# Patient Record
Sex: Female | Born: 1990 | Race: White | Hispanic: No | Marital: Single | Smoking: Never smoker
Health system: Southern US, Community
[De-identification: ages and names within clinical notes are randomized; demographics above are authoritative.]

## PROBLEM LIST (undated history)

## (undated) DIAGNOSIS — F419 Anxiety disorder, unspecified: Secondary | ICD-10-CM

## (undated) DIAGNOSIS — I10 Essential (primary) hypertension: Secondary | ICD-10-CM

## (undated) DIAGNOSIS — F410 Panic disorder [episodic paroxysmal anxiety] without agoraphobia: Secondary | ICD-10-CM

## (undated) HISTORY — PX: CYSTOSCOPY: SUR368

## (undated) HISTORY — DX: Anxiety disorder, unspecified: F41.9

## (undated) HISTORY — DX: Panic disorder (episodic paroxysmal anxiety): F41.0

## (undated) HISTORY — PX: NO PAST SURGERIES: SHX2092

## (undated) HISTORY — DX: Essential (primary) hypertension: I10

---
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
## 2010-03-07 ENCOUNTER — Emergency Department (HOSPITAL_COMMUNITY): Admission: EM | Admit: 2010-03-07 | Discharge: 2010-03-07 | Payer: Self-pay | Admitting: Emergency Medicine

## 2010-11-23 LAB — GLUCOSE, CAPILLARY
Glucose-Capillary: 221 mg/dL — ABNORMAL HIGH (ref 70–99)
Glucose-Capillary: 332 mg/dL — ABNORMAL HIGH (ref 70–99)
Glucose-Capillary: 358 mg/dL — ABNORMAL HIGH (ref 70–99)

## 2010-11-23 LAB — BASIC METABOLIC PANEL
BUN: 12 mg/dL (ref 6–23)
CO2: 27 mEq/L (ref 19–32)
Calcium: 9.6 mg/dL (ref 8.4–10.5)
Chloride: 98 mEq/L (ref 96–112)
Creatinine, Ser: 0.62 mg/dL (ref 0.4–1.2)
GFR calc Af Amer: >60 mL/min (ref >60)
GFR calc non Af Amer: >60 mL/min (ref >60)
Glucose, Bld: 359 mg/dL — ABNORMAL HIGH (ref 70–99)
Potassium: 4.3 mEq/L (ref 3.5–5.1)
Sodium: 136 mEq/L (ref 135–145)

## 2011-01-23 NOTE — Discharge Summary (Signed)
Chelsea Sparks, Chelsea Sparks              ACCOUNT NO.:  [id number]   MEDICAL RECORD NO.:  [id number]          PATIENT TYPE:  INP   LOCATION:  6153                         FACILITY:  [facility]   PHYSICIAN:  Henrietta Hoover, MD    DATE OF BIRTH:  01-26-1991   DATE OF ADMISSION:  01/12/2007  DATE OF DISCHARGE:  01/15/2007                               DISCHARGE SUMMARY   REASON FOR HOSPITALIZATION:  DKA, elevated blood sugars.   SIGNIFICANT FINDINGS:  The patient came in with hypotension and elevated  blood sugars in the 530s, initial pH was 6.9, anion gap of 34,  bicarbonate 4, potassium 4.5.  With treatment these normalized and anion  gap closed and bicarbonate normalized.  During hospitalization the  patient developed fever and back pain.  Urinanalysis showed 11-20 red  blood cells, many bacteria and leucocytes.  Urine culture pending.   TREATMENT:  IV fluid resuscitation treatment of DKA in PICU with 2-bag  method insulin drip.  The patient transitioned to floor on hospital day  #2 and started on Lantus and NovoLog insulin.   OPERATIONS AND PROCEDURES:  None.   DISCHARGE DIAGNOSES:  Diabetic ketoacidosis, type I diabetes, urinary  tract infection.   DISCHARGE INSTRUCTIONS:  Lantus 28 units subcutaneously at bedtime;  ciprofloxacin 500 mg p.o. twice per day for five more days; Humalog 1  unit for _______ greater than 150 correcting before each meal and at  bedtime, and Humalog with a 1-15 carbohydrate correction.   PENDING ISSUES:  Possible depression, urine cultures and sensitivities,  and diabetes.   FOLLOW-UP:  Followup with doctor Dr. Donnie Coffin May 13, Tuesday, at 5:15 p.m.   CONDITION ON DISCHARGE:  Discharge wait 50 kg.     ______________________________  Marlyne Beards, M.D.    ______________________________  Henrietta Hoover, MD    EP/MEDQ  D:  01/15/2007  T:  01/15/2007  Job:  [id number]

## 2011-01-23 NOTE — Discharge Summary (Signed)
Chelsea Sparks, Chelsea Sparks              ACCOUNT NO.:  [id number]   MEDICAL RECORD NO.:  [id number]          PATIENT TYPE:  INP   LOCATION:  6126                         FACILITY:  [facility]   PHYSICIAN:  Orie Rout, M.D.DATE OF BIRTH:  Jul 28, 1991   DATE OF ADMISSION:  11/26/2005  DATE OF DISCHARGE:  12/03/2005                                 DISCHARGE SUMMARY   DISCHARGE DIAGNOSES:  1.  Severe diabetic ketoacidosis, resolved.  2.  Type 1 diabetes.  3.  Hypokalemia.  4.  Hypertension.   DISCHARGE MEDICATIONS:  1.  Lantus 13 units q.h.s.  2.  Humalog two-component sliding scale with meals.  3.  Enalapril 10 mg daily.  4.  K-Dur 20 mEq p.o. b.i.d.   HOSPITAL COURSE:  This is a 20 year old female with a history of type 1  diabetes admitted in severe DKA to the PICU.  Her labs on admission were  significant for an ABG with a pH of 6.78, a bicarb of 5, a CO2 of 8.9, an  anion gap of 23, a glucose of 521, and a creatinine of 1.3.  The patient was  started on an insulin drip of 0.05 units/kg per hour and a two-bag method of  IV fluids.  Bag #1 had LR and bag #2 had D10 half normal saline.  The  patient became hypokalemic on hospital day #2, so we added potassium to her  fluids and diet.  Her gas had closed and acidosis resolved by hospital day  #3, so on that day she was transitioned off the insulin drip to sliding  scale insulin and Lantus and transferred to the floor.  Her potassium  remained low and we started supplementation with K-Dur between 40 and 60 mEq  daily.  The patient tolerated a carb-modified diet and her CBGs remained  under 300 with fastings less than 140 on the day prior to discharge.  Her  urine ketones cleared three days prior to discharge.  Her potassium came up  to 3.3 on the day of discharge.  During this hospitalization we also  increased her enalapril for high blood pressure up to 10 mg daily.  This was  done about three days prior to being sent home.  We also  sent her home with  a potassium supplement.  Dr. Fransico Michael consulted and adjusted her sliding  scale insulin and will be following up with her and her mother as an  outpatient.  During this hospitalization there were also some concerns  raised of the lack of parental involvement in helping this teenager with her  illness, so a CPS report was filed.  The patient also received a consult by  our pediatric psychologist on multiple days.   DISCHARGE WEIGHT:  54.5 kg.   DISCHARGE CONDITION:  Good.   FOLLOW-UP INSTRUCTIONS:  It is critical that she takes all her medications  exactly as prescribed.  This was explained to her and the reason for each of  them.   She has a follow-up appointment with Dr. Donnie Coffin Tuesday, April 3, at 5:15  p.m.  She will need a BMP checked then.  Dr. Van Clines will call her on April 4 and schedule follow-up.      Altamese Cabal, M.D.    ______________________________  Orie Rout, M.D.    KS/MEDQ  D:  12/03/2005  T:  12/05/2005  Job:  [id number]

## 2011-01-23 NOTE — Op Note (Signed)
NAMEPIPER, HASSEBROCK              ACCOUNT NO.:  [id number]   MEDICAL RECORD NO.:  [id number]          PATIENT TYPE:  OIB   LOCATION:  6125                         FACILITY:  [facility]   PHYSICIAN:  Prabhakar D. Pendse, M.D.DATE OF BIRTH:  11/23/1990   DATE OF PROCEDURE:  10/16/2004  DATE OF DISCHARGE:  10/17/2004                                 OPERATIVE REPORT   PREOPERATIVE DIAGNOSIS:  Abscess, of sacrococcygeal area, possible pilonidal  cyst.   POSTOPERATIVE DIAGNOSIS:  Abscess, of sacrococcygeal area, possible  pilonidal cyst.   OPERATION PERFORMED:  Incision and drainage of sacrococcygeal area abscess.   SURGEON:  Prabhakar D. Levie Heritage, M.D.   ASSISTANT:  Nurse.   ANESTHESIA:  Nurse   OPERATIVE INDICATIONS:  This 20 year old girl was noted to have an acute  abscess of sacrococcygeal area.  The abscess had started draining some foul-  smelling purulent material 24 hours prior to this procedure.  The lesion was  about 2 inches in length and 1 inch in width.   OPERATIVE PROCEDURE:  Under satisfactory general endotracheal anesthesia,  the patient in prone position, sacrococcygeal area was thoroughly prepped  and draped in the usual manner. A hemostat was passed through the opening  which was draining purulent material and over the hemostat an incision was  made, carried through all the layers of the skin and subcutaneous tissue.  The abscess cavity was entered, purulent material collected for culture  examination, and debridement was done, the abscess cavity irrigated, packed  with iodoform gauze, and bulky dressing applied. Throughout the procedure,  the patient's vital signs remained stable. The patient withstood the  procedure well and was transferred to recovery room in satisfactory general  condition.      PDP/MEDQ  D:  11/20/2004  T:  11/20/2004  Job:  [id number]

## 2011-01-23 NOTE — Discharge Summary (Signed)
[place]  Patient:    Chelsea Sparks, Chelsea Sparks                     MRN: [id number] Adm. Date:  [id number] Disc. Date: [id number] Attending:  Delle Reining Dictator:   [facility],                           Discharge Summary  DATE OF BIRTH:  Apr 21, 1991  REASON FOR HOSPITALIZATION: 1. Diabetes mellitus. 2. Seasonal allergies.  DISCHARGE SUMMARY:  The patient is an 20 year old white female who was referred by her primary care physician secondary to likely onset of type 1 diabetes.  The patient had presented to his office "the office of Dr. Maryellen Pile" with complaints of being "tired" x 2-3 days, light-headed, and having increasing thirst for the last 4-5 days.  The patient also had experienced increased urination and some vomiting which was non-bilious and non-bloody. The patient had had a few loose stools over the last two days.  The patient had a urinalysis done at the primary cares office which revealed a glucose of greater than 1000 and were positive for ketones.  The patient had also complained of headaches, weight loss of a few pounds over the last two weeks, rhinorrhea, and watery eyes.  Mother notes that the patient had a flu-like illness one week prior to admission with generalized nausea, some vomiting, and diarrhea, and fever.  On presentation to [facility], the patient was found to have a blood glucose of 276.  Anion gap was 9.  Blood was negative for ketones.  The patient received IV fluids and subcutaneous insulin for increased blood glucose.  She was initially started on a regimen of 5 units of Lantus long-acting insulin q.h.s.  She was also given a sliding scale using Humalog lispro.  She maintained usual blood sugars in the 180s to 190s with lunch time peaks in the 200s.  Several days into hospitalization, she was changed to carbohydrate counting. For every one unit of carbohydrates which equalled 15 g, she  received 1 unit of lispro for each meal.  She also had a sliding scale in addition to this to correct for approximation of carbohydrates.  She had regular blood glucose checks before every meal and at nighttime.  The patient also received every day       2 a.m. glucose checks as well.  During most of the hospitalization, the patient had carbohydrate counting with each meal and sliding scale with each meal.  Her nighttime Lantus dose was increased throughout the hospitalization one unit at a time to a final measurement of 8 units at night.  She received extensive diabetic teaching during her hospital stay and extensive nutritional education.  She was able to administer her own insulin and participate in carbohydrate counting.  Her stay at seven days in the hospital was not so much due to an inability to stabilize her blood glucose level, as she did not present in DKA.  However, it was mostly due to less than an ideal social situation and inability to get the patient into a local endocrinologist office.  It is felt that due to social reasons, she needed an extensive stay and monitoring during her first week of insulin therapy.  Upon discharge, there were many discussions with people in the patients life including home, school, and medical.  Discussions took place with Dr. Donnie Coffin, who is  the patients primary doctor, Ms. Hilarie Fredrickson who is the school nurse, and Mr. Gwenith Daily who is the school teacher.  General discussions took place with mother over the phone as she had just started a new job and could not take off work for fear of possible eviction from her apartment.  Everyone involved including the patient new the plan for the week until she could eventually see her endocrinologist, Dr. Gaye Alken.  The plan was for the house staff here at [facility] to contact the patient daily and check on her blood glucose levels, and for any possible problems that may arise in the transition from hospital to  home care.  The patient was given emergency numbers including a card with diabetic educator, the pediatric floor to get in touch with any of the pediatric residents, and 911 in the case of an emergency.  We have asked mother to follow up with primary care doctor when possible in the next one to two weeks.  The patient also has appointment with Dr. Gaye Alken on December 24, 2000.  OTHER ISSUES DURING HOSPITALIZATION:  Included headaches and seasonal allergies, as well as some occasional epigastric abdominal pain.  For her headaches, we usually treated with Tylenol and the patient reported improvement with both rest and Tylenol with most of the headaches.  For the seasonal allergies, we started the patient on Claritin 10 mg p.o. q.d. For the epigastric "burning pain" during hospitalization, we used symptomatic treatment with Mylanta or Maalox.  However, an H2 blocker may be considered for the future if this is a prolonged symptom.  MEDICATIONS ON DISCHARGE: 1. Claritin 10 mg p.o. q.d. 2. Lantus insulin 8 units q.h.s. 3. Lispro insulin, dose per carbohydrate counting and sliding scale.  For    every one card or 15 g of card, the patient will receive 1 unit for each    meal.  The patient also has a sliding scale for insulin to add to the    carbohydrate counting for each meal.  Sliding scale includes the following;    for 200-300 one unit is added, for 301-400 two units are added, for greater    than 400 three units are added.  The patient was also given prescriptions    for ________, test strips, insulin both Lantus and lispro, ketosticks to    check urine when blood glucose is greater than 250, instructions for school    glucometer use, instructions for emergency.  The patient also has been    contacted and has participated in the [facility]    which will be following up with her as an outpatient. DD:  12/26/00 TD:  12/26/00 Job: [id number]

## 2011-01-23 NOTE — Discharge Summary (Signed)
[place]  Patient:    Chelsea Sparks, Chelsea Sparks                     MRN: [id number] Adm. Date:  [id number] Disc. Date: [id number] Attending:  Delle Sparks Dictator:   Chelsea Sparks, M.D. CC:         Chelsea Sparks. Chelsea Sparks, M.D.   Discharge Summary  HISTORY OF PRESENT ILLNESS:  The patient is an 20 year old white female who was referred by her primary care physician, Dr. Teena Sparks. Chelsea Sparks, for admission secondary to new-onset type 1 diabetes, after presenting to his office with complaints of fatigue x 2 to 3 days, light-headedness, polydipsia x 4 to 5 days, polyuria, nonbilious vomiting and nonbloody loose stools x 2 days.  A urinalysis at the physicians office revealed a glucose of greater than 1000 and was positive for ketones.  Patient had also had complaints of headache, weight loss, rhinorrhea, watery eyes and a recent past medical history is significant for a flu-like illness during the previous week, characterized by nausea, vomiting and diarrhea.  HOSPITAL COURSE:  Upon presentation to [facility], the patient was found to have a blood glucose of 276 with an anion gap of 9.  The patients blood was negative for acetone and patient did not have the clinical picture of DKA. She was given IV fluids and blood glucoses were monitored closely.  She was started on a regimen of 5 units of Lantus insulin and a sliding scale in addition to this with Lispro.  She continued to have blood glucoses in the 180s and 190s, with lunch-time peaks in the 200s.  In addition to her regimen, she started using carbohydrate counting, with 15 g of carbohydrates equal to 1 unit of Lispro.  A sliding-scale Lispro insulin was also administered based on pre-meal sugar levels:  With 200-300, giving 1 units; 300-400, giving 2 units; and greater than 400 blood glucose, giving 3 units.  Her Lantus dose was increased during her hospitalization to 8 units, up from 5  units initially.  The Lantus was given only q.h.s.  She has received extensive diabetic teaching and nutritional education while in the hospital and has done well with the self-administration of her insulin and carbohydrate counting. Her stay of seven days was not so much due to her inability to stabilize her blood glucose level, as she did not present in DKA, however, this was due to less than ideal social situation and inability to get Chelsea Sparks into an endocrinologists office.  Her family members were unable to come and stay with her in the hospital and Chelsea Sparks found herself doing most of her treatment alone.  Her mom was starting a new job and was unable to be with her in the hospital daily during the day.  Upon discharge, several discussions took place with Chelsea Sparks primary care doctor, Dr. Donnie Sparks, school nurse, Chelsea Sparks, and school teacher, Chelsea Sparks. All Sparks were informed of plans for her through this week and after discharge, she will see her endocrinologist, Dr. Gaye Sparks.  The diabetic educator, Chelsea Sparks, is also working with Chelsea Sparks intensively and will be talking with Chelsea Sparks school nurse as well.  Members of the house staff will be calling Chelsea Sparks until her appointment with Dr. Gaye Sparks.  We have also asked for the home health care company to provide a visit at the time to the home to assess her safety and progress as Chelsea Sparks transitions from hospital to home administration of  her insulin.  Chelsea Sparks continued to have several headaches during the hospitalization which were usually unilateral, and on one occasion, experienced visual changes, however, her headaches were improved with Tylenol and rest.  Patient also has a history of seasonal allergies for which we started Claritin 10 mg p.o. q.d.  Patient also experienced postprandial epigastric burning pain during the hospitalization on several occasions.  For now, we are recommending Mylanta or Tums, but it may be considered to add  an H2 blocker to her regimen in the future.  DISCHARGE MEDICATIONS: 1. Lantus 8 units q.h.s. 2. Claritin 10 mg p.o. q.d. 3. Lispro insulin per carbohydrate counting at sliding scale referred to in    the summary part of this.  DIABETIC REGIMEN:  Her regimen will be Lantus 8 units at night with Accu-Cheks every morning and with carbohydrate counting.  She will give 1 unit of Lispro for ______  of carbohydrates eaten.  Her sliding scale in addition to this pre-meal dose will be 1 unit if blood glucoses are 200 to 300, 2 units if blood glucoses are 300 to 400 and 3 units if greater than 400.  DD:  04/12/01 TD:  04/14/01 Job: [id number]

## 2011-01-23 NOTE — Discharge Summary (Signed)
NAMEAERIANNA, Chelsea Sparks              ACCOUNT NO.:  [id number]   MEDICAL RECORD NO.:  [id number]          PATIENT TYPE:  OIB   LOCATION:  6125                         FACILITY:  [facility]   PHYSICIAN:  Pediatrics Resident    DATE OF BIRTH:  May 19, 1991   DATE OF ADMISSION:  10/16/2004  DATE OF DISCHARGE:  10/17/2004                                 DISCHARGE SUMMARY   REASON FOR ADMISSION:  Status post pilonidal abscess drainage.   HOSPITAL COURSE:  [name] is a 20 year old female with a history of type 1  diabetes who was admitted status post an incision and drainage of a sacral  abscess which was found to be a pilonidal abscess.  Preliminary abscess  cultures grew moderate white blood cells, predominantly PMNs, no squamous  epithelial cells, moderate gram-positive cocci in pairs, moderate gram-  positive rods, few gram-negative rods.  Treatment included IV fluids, hep-  locked.  She was given her Regular insulin regimen of 1 unit per 15 grams of  carbohydrates, 1 unit per 20 mg blood glucose over 120, and Lantus 23 units  at bedtime.  She also received Tylenol No. 3 p.r.n. for pain.  She received  amoxicillin 500 mg q.12h. for E. coli prophylaxis.   PROCEDURE:  Drainage and packing of pilonidal abscess October 16, 2004.   FINAL DIAGNOSIS:  Pilonidal abscess.   DISCHARGE INSTRUCTIONS:  Apply Bactroban ointment to wound 2-3 times per  day, apply warm compresses to wound 2-3 times per day.  Tylenol No. 3 one  p.o. q.6h. as needed for pain, otherwise use regular Tylenol or Motrin for  pain.  Follow up with Dr. Levie Heritage on Tuesday, October 21, 2004 at 2 p.m. for  removal of packing.  Pending results October 16, 2004 wound culture.  Pending at the time of discharge followup with Dr. Levie Heritage on October 21, 2004 at 2 p.m.  Discharge weight 54 kg.   CONDITION ON DISCHARGE:  Stable.   DICTATED BY:  Dr. Tina Griffiths.      PR/MEDQ  D:  10/17/2004  T:  10/18/2004  Job:  [id number]

## 2011-02-07 ENCOUNTER — Emergency Department (HOSPITAL_COMMUNITY)
Admission: EM | Admit: 2011-02-07 | Discharge: 2011-02-07 | Disposition: A | Discharge status: Home / Self Care | Payer: Self-pay | Attending: Emergency Medicine | Admitting: Emergency Medicine

## 2011-02-07 DIAGNOSIS — L03119 Cellulitis of unspecified part of limb: Secondary | ICD-10-CM | POA: Insufficient documentation

## 2011-02-07 DIAGNOSIS — E109 Type 1 diabetes mellitus without complications: Secondary | ICD-10-CM | POA: Insufficient documentation

## 2011-02-07 DIAGNOSIS — Z794 Long term (current) use of insulin: Secondary | ICD-10-CM | POA: Insufficient documentation

## 2011-02-07 DIAGNOSIS — L02419 Cutaneous abscess of limb, unspecified: Secondary | ICD-10-CM | POA: Insufficient documentation

## 2011-06-04 LAB — POCT I-STAT, CHEM 8
Chloride: 102
HCT: 44
Hemoglobin: 15
TCO2: 27

## 2011-10-23 ENCOUNTER — Encounter (HOSPITAL_COMMUNITY): Payer: Self-pay | Admitting: Emergency Medicine

## 2011-10-23 ENCOUNTER — Emergency Department (HOSPITAL_COMMUNITY)
Admission: EM | Admit: 2011-10-23 | Discharge: 2011-10-23 | Disposition: A | Discharge status: Home / Self Care | Payer: 59 | Attending: Emergency Medicine | Admitting: Emergency Medicine

## 2011-10-23 DIAGNOSIS — Z794 Long term (current) use of insulin: Secondary | ICD-10-CM | POA: Insufficient documentation

## 2011-10-23 DIAGNOSIS — R5381 Other malaise: Secondary | ICD-10-CM | POA: Insufficient documentation

## 2011-10-23 DIAGNOSIS — R739 Hyperglycemia, unspecified: Secondary | ICD-10-CM

## 2011-10-23 DIAGNOSIS — E119 Type 2 diabetes mellitus without complications: Secondary | ICD-10-CM | POA: Insufficient documentation

## 2011-10-23 DIAGNOSIS — R51 Headache: Secondary | ICD-10-CM | POA: Insufficient documentation

## 2011-10-23 DIAGNOSIS — R1110 Vomiting, unspecified: Secondary | ICD-10-CM | POA: Insufficient documentation

## 2011-10-23 DIAGNOSIS — R5383 Other fatigue: Secondary | ICD-10-CM | POA: Insufficient documentation

## 2011-10-23 LAB — GLUCOSE, CAPILLARY: Glucose-Capillary: 217 mg/dL — ABNORMAL HIGH (ref 70–99)

## 2011-10-23 LAB — URINALYSIS, ROUTINE W REFLEX MICROSCOPIC
Bilirubin Urine: NEGATIVE
Glucose, UA: >1000 mg/dL — AB
Hgb urine dipstick: NEGATIVE
Ketones, ur: 40 mg/dL — AB
Leukocytes, UA: NEGATIVE
Protein, ur: NEGATIVE mg/dL
Specific Gravity, Urine: 1.023 (ref 1.005–1.030)
Urobilinogen, UA: 0.2 mg/dL (ref 0.0–1.0)
pH: 6 (ref 5.0–8.0)

## 2011-10-23 LAB — BASIC METABOLIC PANEL
BUN: 13 mg/dL (ref 6–23)
CO2: 25 mEq/L (ref 19–32)
Chloride: 95 mEq/L — ABNORMAL LOW (ref 96–112)
Creatinine, Ser: 0.4 mg/dL — ABNORMAL LOW (ref 0.50–1.10)
GFR calc Af Amer: >90 mL/min (ref >90)
GFR calc non Af Amer: >90 mL/min (ref >90)
Glucose, Bld: 282 mg/dL — ABNORMAL HIGH (ref 70–99)

## 2011-10-23 LAB — URINE MICROSCOPIC-ADD ON

## 2011-10-23 LAB — POCT PREGNANCY, URINE: Preg Test, Ur: NEGATIVE

## 2011-10-23 MED ORDER — INSULIN LISPRO 100 UNIT/ML ~~LOC~~ SOLN: 0.0000 [IU] | Freq: Three times a day (TID) | SUBCUTANEOUS | Status: DC

## 2011-10-23 MED ORDER — SODIUM CHLORIDE 0.9 % IV BOLUS (SEPSIS)
1000.0000 mL | Freq: Once | INTRAVENOUS | Status: AC
  Administered 2011-10-23: 1000 mL via INTRAVENOUS

## 2011-10-23 MED ORDER — INSULIN GLARGINE 100 UNIT/ML ~~LOC~~ SOLN: 24.0000 [IU] | Freq: Every day | SUBCUTANEOUS | Status: DC

## 2011-10-23 NOTE — ED Provider Notes (Signed)
History     CSN: [id number]  Arrival date & time 10/23/11  1433   First MD Initiated Contact with Patient 10/23/11 1547      Chief Complaint  Patient presents with  . Hyperglycemia    (Consider location/radiation/quality/duration/timing/severity/associated sxs/prior treatment) HPI complains blood sugar up this morning, was 403 11 AM today vomited one time. Treated herself with insulin felt improved blood sugar went down to 239, then increased to 259 immediately prior to coming here. Patient has not been taking insulin religiously as she has been in short supply. Has slight headache presently no nausea present. No other complaint Past Medical History  Diagnosis Date  . Diabetes mellitus     History reviewed. No pertinent past surgical history.  History reviewed. No pertinent family history.  History  Substance Use Topics  . Smoking status: Never Smoker   . Smokeless tobacco: Not on file  . Alcohol Use: Yes    OB History    Grav Para Term Preterm Abortions TAB SAB Ect Mult Living                  Review of Systems  Constitutional: Positive for fatigue.  Respiratory: Negative.   Cardiovascular: Negative.   Gastrointestinal: Negative.   Musculoskeletal: Negative.   Skin: Negative.   Neurological: Positive for headaches.  Hematological: Negative.   Psychiatric/Behavioral: Negative.     Allergies  Review of patient's allergies indicates no known allergies.  Home Medications  No current outpatient prescriptions on file.  BP 109/83  Pulse 122  Temp(Src) 98.6 F (37 C) (Oral)  Resp 18  Ht 5\' 5"  (1.651 m)  Wt 102 lb (46.267 kg)  BMI 16.97 kg/m2  SpO2 100%  LMP 10/13/2011  Physical Exam  Constitutional: She appears well-developed and well-nourished.  HENT:  Head: Normocephalic and atraumatic.  Eyes: Conjunctivae are normal. Pupils are equal, round, and reactive to light.  Neck: Neck supple. No tracheal deviation present. No thyromegaly present.    Cardiovascular: Regular rhythm.   No murmur heard. Pulmonary/Chest: Effort normal and breath sounds normal.  Abdominal: Soft. Bowel sounds are normal. She exhibits no distension. There is no tenderness.  Musculoskeletal: Normal range of motion. She exhibits no edema and no tenderness.  Neurological: She is alert. Coordination normal.  Skin: Skin is warm and dry. No rash noted.  Psychiatric: She has a normal mood and affect.    ED Course  Procedures (including critical care time) Results for orders placed during the hospital encounter of 10/23/11  GLUCOSE, CAPILLARY      Component Value Range   Glucose-Capillary 259 (*) 70 - 99 (mg/dL)  BASIC METABOLIC PANEL      Component Value Range   Sodium 134 (*) 135 - 145 (mEq/L)   Potassium 3.7  3.5 - 5.1 (mEq/L)   Chloride 95 (*) 96 - 112 (mEq/L)   CO2 25  19 - 32 (mEq/L)   Glucose, Bld 282 (*) 70 - 99 (mg/dL)   BUN 13  6 - 23 (mg/dL)   Creatinine, Ser 5.62 (*) 0.50 - 1.10 (mg/dL)   Calcium 9.6  8.4 - 13.0 (mg/dL)   GFR calc non Af Amer >90  >90 (mL/min)   GFR calc Af Amer >90  >90 (mL/min)  POCT PREGNANCY, URINE      Component Value Range   Preg Test, Ur NEGATIVE  NEGATIVE   GLUCOSE, CAPILLARY      Component Value Range   Glucose-Capillary 217 (*) 70 - 99 (mg/dL)  Comment 1 Documented in Chart     Comment 2 Notify RN     No results found.  Labs Reviewed  GLUCOSE, CAPILLARY - Abnormal; Notable for the following:    Glucose-Capillary 259 (*)    All other components within normal limits   6:30 PM patient feels improved after intravenous hydration No results found.  No diagnosis found.    MDM  Plan prescriptions for insulin, patient has syringes Referral [facility] Diagnosis hyperglycemia        Doug Sou, MD 10/23/11 1900

## 2011-10-23 NOTE — ED Notes (Signed)
Pt took blood glucose this am and it was 403, pt took 8 units Humalog sub-q around 1130. Pt states she is not consistent taking her blood sugars or taking insulin. Pt reports N/V today. Denies other c/o.

## 2011-11-20 ENCOUNTER — Ambulatory Visit (INDEPENDENT_AMBULATORY_CARE_PROVIDER_SITE_OTHER): Payer: 59 | Admitting: Internal Medicine

## 2011-11-20 VITALS — BP 105/74 | HR 118 | Temp 98.1°F | Resp 16 | Ht 67.0 in | Wt 114.0 lb

## 2011-11-20 DIAGNOSIS — R110 Nausea: Secondary | ICD-10-CM

## 2011-11-20 DIAGNOSIS — E109 Type 1 diabetes mellitus without complications: Secondary | ICD-10-CM

## 2011-11-20 DIAGNOSIS — R5383 Other fatigue: Secondary | ICD-10-CM

## 2011-11-20 LAB — COMPREHENSIVE METABOLIC PANEL
ALT: 10 U/L (ref 0–35)
AST: 12 U/L (ref 0–37)
Albumin: 4.7 g/dL (ref 3.5–5.2)
BUN: 13 mg/dL (ref 6–23)
Calcium: 9.8 mg/dL (ref 8.4–10.5)
Chloride: 102 mEq/L (ref 96–112)
Potassium: 4.1 mEq/L (ref 3.5–5.3)

## 2011-11-20 LAB — POCT CBC
MCH, POC: 29.4 pg (ref 27–31.2)
MCHC: 33.4 g/dL (ref 31.8–35.4)
MCV: 87.8 fL (ref 80–97)
POC Granulocyte: 4.2 (ref 2–6.9)
RBC: 4.7 M/uL (ref 4.04–5.48)
WBC: 7.2 10*3/uL (ref 4.6–10.2)

## 2011-11-20 LAB — POCT UA - MICROSCOPIC ONLY
Casts, Ur, LPF, POC: NEGATIVE
Crystals, Ur, HPF, POC: NEGATIVE
Mucus, UA: NEGATIVE
Yeast, UA: NEGATIVE

## 2011-11-20 LAB — POCT URINALYSIS DIPSTICK
Leukocytes, UA: NEGATIVE
Nitrite, UA: NEGATIVE
Protein, UA: NEGATIVE
Spec Grav, UA: 1.01
Urobilinogen, UA: 0.2
pH, UA: 6

## 2011-11-20 MED ORDER — INSULIN GLARGINE 100 UNIT/ML ~~LOC~~ SOLN: 24.0000 [IU] | Freq: Every day | SUBCUTANEOUS | Status: DC

## 2011-11-20 MED ORDER — INSULIN LISPRO 100 UNIT/ML ~~LOC~~ SOLN: 0.0000 [IU] | Freq: Three times a day (TID) | SUBCUTANEOUS | Status: DC

## 2011-11-20 MED ORDER — ONDANSETRON 4 MG PO TBDP
8.0000 mg | ORAL_TABLET | Freq: Once | ORAL | Status: AC
  Administered 2011-11-20: 8 mg via ORAL

## 2011-11-20 NOTE — Progress Notes (Signed)
  Subjective:    Patient ID: Chelsea Sparks, female    DOB: 02-03-1991, 21 y.o.   MRN: [id number]  HPI  Chelsea Sparks is seen with her Mother today as a new patient.  She is a type I diabetic that has not had insurance for the past three years and her diabetes has not been well controlled.  She comes in today complaining of nausea, fatigue.  She has an appointment to see a [facility] endocrinologist May 2nd but would like an appointment sooner.  She has never used an insulin pump, only pens.  She is unemployed, non-smoker.    Review of Systems  Constitutional: Positive for fatigue.  HENT: Negative.   Eyes: Negative.   Respiratory: Negative.   Cardiovascular: Negative.  Negative for palpitations.  Gastrointestinal: Positive for diarrhea.  Musculoskeletal: Negative.   Neurological: Positive for dizziness.  Hematological: Negative.   Psychiatric/Behavioral: Negative.        Objective:   Physical Exam  Vitals reviewed. Constitutional: She is oriented to person, place, and time. She appears well-developed and well-nourished.  HENT:  Head: Normocephalic.  Neck: Neck supple.  Cardiovascular: Regular rhythm and normal heart sounds.        tachycardic  Pulmonary/Chest: Effort normal and breath sounds normal. No respiratory distress.  Abdominal: Soft.  Neurological: She is alert and oriented to person, place, and time.  Skin: Skin is warm and dry.  Psychiatric: She has a normal mood and affect. Her behavior is normal.   Less tachycardic after IV fluids given       Assessment & Plan:  Type I DM with overall poor control, A1C is 9.7.  Insulins are refilled today and she will keep her upcoming endo appt.  Pt feels better after IV fluid, less tachycardic. No other abnormalities found, CMP pending.   AVS printed and given to pt.

## 2011-11-20 NOTE — Patient Instructions (Signed)
Take your insulins as prescribed in the past and keep the upcoming appointment with your endocrinologist.  Keep yourself well hydrated.  RTC if symptoms worsen  Diabetes, Type 1 Diabetes is a long-term (chronic) disease. It occurs when the cells in the pancreas that make insulin (a hormone) are destroyed and can no longer make insulin. Type 1 diabetes was also previously called juvenile-onset diabetes. It most often occurs before the age of 25, but it can also occur in older people. CAUSES  Among other factors, the following may cause type 1 diabetes:  Genetics. This means it may be passed to you by your parents.   The beta cells that make insulin are destroyed. The cause of this is unknown.  SYMPTOMS   Urinating more than usual (or bed-wetting in children).   Drinking more than usual.   Irritability.   Feeling very hungry.   Weight loss (may be rapid).   Nausea and vomiting.   Abdominal pain.   Feeling more tired than usual (fatigue).   Rapid breathing.   Difficulty staying awake.   Night sweats.  DIAGNOSIS  Your blood is tested to determine whether you have type 1 diabetes. TREATMENT   You will need to check your blood glucose (sugar) levels several times a day.   You will need to balance insulin, a healthy meal plan, and exercise to maintain normal blood glucose.   Education and ongoing support is recommended.   You should have regular checkups and immunizations.  HOME CARE INSTRUCTIONS   Never run out of insulin. It is needed every day.   Do not skip insulin doses.   Do not skip meals. Eat healthy.   Follow your treatment and monitoring plan.   Wear a pendant or bracelet stating you have diabetes and take insulin.   If you start a new exercise or sport, watch for low blood glucose (hypoglycemia) symptoms. Insulin dosing may need to be adjusted.   Follow up with your caregiver regularly.   Tell your workplace or school about your diabetes treatment plan.   SEEK MEDICAL CARE IF:   You have problems keeping your blood glucose in target range.   You have blood glucose readings that are often too high or too low.   You have problems with your medicines.   You have symptoms of an illness that do not improve after 24 hours.   You have a sore or wound that is not healing.   You notice a change in vision or a new problem with your vision.   You have a fever.  MAKE SURE YOU:  Understand these instructions.   Will watch your condition.   Will get help right away if you are not doing well or get worse.  Document Released: 08/21/2000 Document Revised: 08/13/2011 Document Reviewed: 02/09/2011 [facility] Patient Information 2012 [place], [place].

## 2011-11-29 ENCOUNTER — Emergency Department (HOSPITAL_COMMUNITY)
Admission: EM | Admit: 2011-11-29 | Discharge: 2011-11-30 | Disposition: A | Discharge status: Home / Self Care | Payer: 59 | Attending: Emergency Medicine | Admitting: Emergency Medicine

## 2011-11-29 DIAGNOSIS — R300 Dysuria: Secondary | ICD-10-CM | POA: Insufficient documentation

## 2011-11-29 DIAGNOSIS — R000 Tachycardia, unspecified: Secondary | ICD-10-CM | POA: Insufficient documentation

## 2011-11-29 DIAGNOSIS — E109 Type 1 diabetes mellitus without complications: Secondary | ICD-10-CM | POA: Insufficient documentation

## 2011-11-29 DIAGNOSIS — R109 Unspecified abdominal pain: Secondary | ICD-10-CM | POA: Insufficient documentation

## 2011-11-29 DIAGNOSIS — Z794 Long term (current) use of insulin: Secondary | ICD-10-CM | POA: Insufficient documentation

## 2011-11-29 DIAGNOSIS — R110 Nausea: Secondary | ICD-10-CM | POA: Insufficient documentation

## 2011-11-30 ENCOUNTER — Encounter (HOSPITAL_COMMUNITY): Payer: Self-pay | Admitting: *Deleted

## 2011-11-30 LAB — URINALYSIS, ROUTINE W REFLEX MICROSCOPIC
Bilirubin Urine: NEGATIVE
Hgb urine dipstick: NEGATIVE
Protein, ur: NEGATIVE mg/dL
Urobilinogen, UA: 0.2 mg/dL (ref 0.0–1.0)

## 2011-11-30 LAB — URINE MICROSCOPIC-ADD ON

## 2011-11-30 LAB — COMPREHENSIVE METABOLIC PANEL
ALT: 8 U/L (ref 0–35)
AST: 11 U/L (ref 0–37)
Albumin: 4.3 g/dL (ref 3.5–5.2)
Calcium: 10 mg/dL (ref 8.4–10.5)
Sodium: 135 mEq/L (ref 135–145)
Total Protein: 8 g/dL (ref 6.0–8.3)

## 2011-11-30 LAB — CBC
MCH: 29.3 pg (ref 26.0–34.0)
MCV: 86.1 fL (ref 78.0–100.0)
Platelets: 322 10*3/uL (ref 150–400)
RDW: 11.4 % — ABNORMAL LOW (ref 11.5–15.5)

## 2011-11-30 MED ORDER — MORPHINE SULFATE 4 MG/ML IJ SOLN
4.0000 mg | Freq: Once | INTRAMUSCULAR | Status: AC
  Administered 2011-11-30: 4 mg via INTRAVENOUS
  Filled 2011-11-30: qty 1

## 2011-11-30 MED ORDER — PROMETHAZINE HCL 25 MG PO TABS: 25.0000 mg | ORAL_TABLET | Freq: Four times a day (QID) | ORAL | Status: DC | PRN

## 2011-11-30 MED ORDER — OXYCODONE-ACETAMINOPHEN 5-325 MG PO TABS: 1.0000 | ORAL_TABLET | ORAL | Status: AC | PRN

## 2011-11-30 MED ORDER — ONDANSETRON HCL 4 MG/2ML IJ SOLN
4.0000 mg | Freq: Once | INTRAMUSCULAR | Status: AC
Start: 2011-11-30 — End: 2011-11-30
  Administered 2011-11-30: 4 mg via INTRAVENOUS
  Filled 2011-11-30: qty 2

## 2011-11-30 MED ORDER — CIPROFLOXACIN HCL 500 MG PO TABS
500.0000 mg | ORAL_TABLET | Freq: Once | ORAL | Status: AC
  Administered 2011-11-30: 500 mg via ORAL
  Filled 2011-11-30: qty 1

## 2011-11-30 MED ORDER — CIPROFLOXACIN HCL 500 MG PO TABS: 500.0000 mg | ORAL_TABLET | Freq: Two times a day (BID) | ORAL | Status: AC

## 2011-11-30 MED ORDER — SODIUM CHLORIDE 0.9 % IV BOLUS (SEPSIS)
1000.0000 mL | Freq: Once | INTRAVENOUS | Status: AC
  Administered 2011-11-30: 1000 mL via INTRAVENOUS

## 2011-11-30 NOTE — Discharge Instructions (Signed)
You have been diagnosed with undifferentiated abdominal pain.  Abdominal pain can be caused by many things. Your caregiver evaluates the seriousness of your pain by an examination and possibly blood or urine tests and imaging (CT scan, x-rays, ultrasound). Many cases can be observed and treated at home after initial evaluation in the emergency department. Even though you are being discharged home, abdominal pain can be unpredictable. Therefore, you need a repeat exam if your pain does not resolve, returns, or worsens. Most patient's with abdominal pain do not need to be admitted to the hospital or have surgery, but serious problems like appendicitis and gallbladder attacks can start out as nonspecific pain. Many abdominal conditions cannot be diagnosed in 1 visit, so followup evaluations are very important.  Seek immediate medical attention if:  *The pain does not go away or becomes severe. *Temperature above 101 develops *Repeated vomiting occurs(multiple episodes) *The pain becomes localized to portions of the abdomen. The right side could possibly be appendicitis. In an adult, the left lower portion of the abdomen could be colitis or diverticulitis. *Blood is being passed in stools or vomit *Return also if you develop chest pain, difficulty breathing, dizziness or fainting, or become confused poorly responsive or inconsolable (young children).     If you do not have a physician, you should reference the below phone numbers and call in the morning to establish follow up care.  RESOURCE GUIDE  Dental Problems  Patients with Medicaid: [facility]                     [facility] [address]                                           [address] Phone:  [phone]                                                  Phone:  [phone]  If unable to pay or uninsured, contact:  [facility] or [facility] to become qualified for the adult dental  clinic.  Chronic Pain Problems Contact [facility]  [phone] Patients need to be referred by their primary care doctor.  Insufficient Money for Medicine Contact [employer]:  call "211" or [facility] 271-5999.  No Primary Care Doctor Call [facility]  832-8000 Other agencies that provide inexpensive medical care    [facility]  [phone]    [facility]  [phone]    [facility]  [phone]    [facility] Clinic  [phone]    [facility]  [phone]    [facility]  [phone]  Psychological Services [facility]  [phone] [facility]  [phone] [facility]   800 853-5163 (emergency services 641-4993)  Substance Abuse Resources [facility]  336-882-2125 [facility] 336-784-9470 [facility] 336-285-9073 [facility] 336-845-3988 Residential & Outpatient Substance Abuse Program  800-659-3381  Abuse/Neglect [place] Child Abuse Hotline (336) 641-3795 [place] [facility] Abuse Hotline 800-378-5315 (After Hours)  Emergency Shelter [facility] (336) 271-5985  [facility] (336) 275-9566 [facility]   Services (704) 372-4663  MRSA Hotline #:   832-7006    [place] [facility]  [facility] [place]     [employer]                          [facility] [address] [place]                       [address]      [address]  [place]                                                [place]                            [place] Phone:  [phone]                                   Phone:  [phone]                 Phone:  [phone]  [facility] Phone:  [phone]  [place] Child Abuse Hotline (336) 342-1394 (336) 342-3537 (After Hours)    

## 2011-11-30 NOTE — ED Notes (Signed)
Patient given discharge instructions, information, prescriptions, and diet order. Patient states that they adequately understand discharge information given and to return to ED if symptoms return or worsen.     

## 2011-11-30 NOTE — ED Provider Notes (Signed)
History     CSN: [id number]  Arrival date & time 11/29/11  2328   First MD Initiated Contact with Patient 11/30/11 0133      Chief Complaint  Patient presents with  . Flank Pain    kidney pain, hurts to urinate, states was treated two weeks ago for dehydration diagnosed by [facility],  back pain, nausea,  constant urge to go to bathroom    (Consider location/radiation/quality/duration/timing/severity/associated sxs/prior treatment) HPI Comments: 21 year old female with a history of type 1 diabetes who presents with bilateral flank pain, dysuria and approximately 2 weeks of nausea and abdominal pain. This has been persistent from day today, gradually getting worse and not associated with fevers or chills. She has been seen at the urgent care where she was told that she did not have a urinary infection.  She denies headache, sore throat, numbness, weakness, diarrhea.  The history is provided by the patient and a relative.    Past Medical History  Diagnosis Date  . Diabetes mellitus     History reviewed. No pertinent past surgical history.  History reviewed. No pertinent family history.  History  Substance Use Topics  . Smoking status: Never Smoker   . Smokeless tobacco: Not on file  . Alcohol Use: No    OB History    Grav Para Term Preterm Abortions TAB SAB Ect Mult Living                  Review of Systems  All other systems reviewed and are negative.    Allergies  Review of patient's allergies indicates no known allergies.  Home Medications   Current Outpatient Rx  Name Route Sig Dispense Refill  . INSULIN GLARGINE 100 UNIT/ML [place] SOLN Subcutaneous Inject 24 Units into the skin at bedtime.    . INSULIN LISPRO (HUMAN) 100 UNIT/ML [place] SOLN Subcutaneous Inject 0-10 Units into the skin 3 (three) times daily before meals. Sliding scale insulin; takes 1 unit of insulin per 40 score over 150 base CBG    . NAPROXEN SODIUM 220 MG PO TABS Oral Take 440 mg by mouth 2 (two)  times daily as needed. For pain.    Mar[place] Kitchen CIPROFLOXACIN HCL 500 MG PO TABS Oral Take 1 tablet (500 mg total) by mouth 2 (two) times daily. 14 tablet 0  . OXYCODONE-ACETAMINOPHEN 5-325 MG PO TABS Oral Take 1 tablet by mouth every 4 (four) hours as needed for pain. May take 2 tablets PO q 6 hours for severe pain - Do not take with Tylenol as this tablet already contains tylenol 15 tablet 0  . PROMETHAZINE HCL 25 MG PO TABS Oral Take 1 tablet (25 mg total) by mouth every 6 (six) hours as needed for nausea. 12 tablet 0    BP 114/79  Pulse 119  Temp(Src) 98.7 F (37.1 C) (Oral)  Resp 20  Wt 107 lb (48.535 kg)  SpO2 100%  LMP 11/13/2011  Physical Exam  Nursing note and vitals reviewed. Constitutional: She appears well-developed and well-nourished.       Uncomfortable appearing  HENT:  Head: Normocephalic and atraumatic.  Mouth/Throat: Oropharynx is clear and moist. No oropharyngeal exudate.  Eyes: Conjunctivae and EOM are normal. Pupils are equal, round, and reactive to light. Right eye exhibits no discharge. Left eye exhibits no discharge. No scleral icterus.  Neck: Normal range of motion. Neck supple. No JVD present. No thyromegaly present.  Cardiovascular: Regular rhythm, normal heart sounds and intact distal pulses.  Exam reveals no  gallop and no friction rub.   No murmur heard.      Tachycardia to 110  Pulmonary/Chest: Effort normal and breath sounds normal. No respiratory distress. She has no wheezes. She has no rales.  Abdominal: Soft. Bowel sounds are normal. She exhibits no distension and no mass. There is tenderness ( Mild epigastric and upper abdominal tenderness, no lower abdominal tenderness, non-peritoneal).       Bilateral mild CVA tenderness  Musculoskeletal: Normal range of motion. She exhibits no edema and no tenderness.  Lymphadenopathy:    She has no cervical adenopathy.  Neurological: She is alert. Coordination normal.  Skin: Skin is warm and dry. No rash noted. No  erythema.  Psychiatric: She has a normal mood and affect. Her behavior is normal.    ED Course  Procedures (including critical care time)  Labs Reviewed  URINALYSIS, ROUTINE W REFLEX MICROSCOPIC - Abnormal; Notable for the following:    APPearance CLOUDY (*)    Glucose, UA 100 (*)    Leukocytes, UA SMALL (*)    All other components within normal limits  CBC - Abnormal; Notable for the following:    RDW 11.4 (*)    All other components within normal limits  URINE MICROSCOPIC-ADD ON - Abnormal; Notable for the following:    Squamous Epithelial / LPF MANY (*)    Bacteria, UA FEW (*)    All other components within normal limits  COMPREHENSIVE METABOLIC PANEL - Abnormal; Notable for the following:    Glucose, Bld 130 (*)    Creatinine, Ser 0.35 (*)    All other components within normal limits  PREGNANCY, URINE  LIPASE, BLOOD  URINE CULTURE   No results found.   1. Abdominal pain       MDM  Patient is tearful and appears uncomfortable, states that her blood sugars have been under 200 for the last 2 weeks and has had good control. Urinalysis reveals mild glucosuria but no signs of infection, has few bacteria 3-6 white blood cells and many epithelial cells. She is nitrate negative and not pregnant. Blood counts are normal.  Because of abdominal tenderness we'll check cooperative metabolic panel and lipase, IV fluids pain medications and antiemetics.  Urine culture sent, labs otherwise unremarkable, patient has improved significantly after intravenous morphine and IV fluids. Patient states that she feels great, wants to go home and will followup as an outpatient. We'll start ciprofloxacin as the patient has had urinary symptoms pending culture results.  Vida Roller, MD 11/30/11 [phone]

## 2011-11-30 NOTE — ED Notes (Signed)
Pt sts she has been having flank pain for 2 weeks and chills. Pt sts no change in urinary elimination pattern, no odor or frequency.

## 2011-12-01 LAB — URINE CULTURE: Culture  Setup Time: 201303250937

## 2011-12-17 ENCOUNTER — Encounter (HOSPITAL_COMMUNITY): Payer: Self-pay | Admitting: Emergency Medicine

## 2011-12-17 ENCOUNTER — Emergency Department (HOSPITAL_COMMUNITY)
Admission: EM | Admit: 2011-12-17 | Discharge: 2011-12-18 | Disposition: A | Discharge status: Home / Self Care | Payer: 59 | Attending: Emergency Medicine | Admitting: Emergency Medicine

## 2011-12-17 DIAGNOSIS — K5900 Constipation, unspecified: Secondary | ICD-10-CM | POA: Insufficient documentation

## 2011-12-17 DIAGNOSIS — E119 Type 2 diabetes mellitus without complications: Secondary | ICD-10-CM | POA: Insufficient documentation

## 2011-12-17 DIAGNOSIS — R109 Unspecified abdominal pain: Secondary | ICD-10-CM | POA: Insufficient documentation

## 2011-12-17 NOTE — ED Notes (Signed)
Pt alert, nad, c/o gen abd pain, onset a month ago, seen several times with same c/o, resp even unlabored, denies bleeding or discharge, denies changes in bowel or bladder

## 2011-12-18 ENCOUNTER — Emergency Department (HOSPITAL_COMMUNITY): Payer: 59

## 2011-12-18 NOTE — ED Provider Notes (Signed)
History     CSN: [id number]  Arrival date & time 12/17/11  2239   First MD Initiated Contact with Patient 12/17/11 2311      Chief Complaint  Patient presents with  . Abdominal Pain    (Consider location/radiation/quality/duration/timing/severity/associated sxs/prior treatment) Patient is a 21 y.o. female presenting with abdominal pain. The history is provided by the patient.  Abdominal Pain The primary symptoms of the illness include abdominal pain. The primary symptoms of the illness do not include fever, nausea, vomiting, dysuria, vaginal discharge or vaginal bleeding.  Additional symptoms associated with the illness include constipation. Symptoms associated with the illness do not include chills. Associated symptoms comments: She reports lower abdominal pain that is recurrent for the past one month. She has had a 7 pound weight loss. No nausea, vomiting. The pain has subsided at this time. She reports a problem with constipation and starting a new diet today that is high in fiber, however, she had a small bowel movement prior to onset of pain. No bleeding with bowel movements. . Associated medical issues comments: She is a type 1 diabetic who reports her blood sugar is usually well controlled..    Past Medical History  Diagnosis Date  . Diabetes mellitus     History reviewed. No pertinent past surgical history.  No family history on file.  History  Substance Use Topics  . Smoking status: Never Smoker   . Smokeless tobacco: Not on file  . Alcohol Use: No    OB History    Grav Para Term Preterm Abortions TAB SAB Ect Mult Living                  Review of Systems  Constitutional: Negative for fever and chills.  HENT: Negative.   Respiratory: Negative.   Cardiovascular: Negative.   Gastrointestinal: Positive for abdominal pain and constipation. Negative for nausea, vomiting and blood in stool.  Genitourinary: Negative for dysuria, vaginal bleeding, vaginal discharge  and menstrual problem.  Musculoskeletal: Negative.   Skin: Negative.   Neurological: Negative.     Allergies  Review of patient's allergies indicates no known allergies.  Home Medications   Current Outpatient Rx  Name Route Sig Dispense Refill  . INSULIN GLARGINE 100 UNIT/ML [place] SOLN Subcutaneous Inject 23 Units into the skin at bedtime.     . INSULIN LISPRO (HUMAN) 100 UNIT/ML [place] SOLN Subcutaneous Inject 0-10 Units into the skin 3 (three) times daily before meals. Sliding scale insulin; takes 1 unit of insulin per 40 score over 150 base CBG    . OXYCODONE-ACETAMINOPHEN 5-325 MG PO TABS Oral Take 1 tablet by mouth every 4 (four) hours as needed.      BP 111/77  Pulse 123  Temp(Src) 98.3 F (36.8 C) (Oral)  Resp 18  Wt 107 lb (48.535 kg)  SpO2 100%  LMP 12/12/2011  Physical Exam  Constitutional: She appears well-developed and well-nourished.  HENT:  Head: Normocephalic.  Neck: Normal range of motion. Neck supple.  Cardiovascular: Normal rate and regular rhythm.   Pulmonary/Chest: Effort normal and breath sounds normal.  Abdominal: Soft. Bowel sounds are normal. There is no rebound and no guarding.       Mildly tender across lower abdomen without rebound or guarding.  Musculoskeletal: Normal range of motion.  Neurological: She is alert. No cranial nerve deficit.  Skin: Skin is warm and dry. No rash noted.  Psychiatric: She has a normal mood and affect.    ED Course  Procedures (including  critical care time)   Labs Reviewed  GLUCOSE, CAPILLARY   Dg Abd 2 Views  12/18/2011  *RADIOLOGY REPORT*  Clinical Data: Upper abdominal pain for 1 month; nausea.  ABDOMEN - 2 VIEW  Comparison: Lumbar spine radiographs performed 03/01/2007  Findings: The visualized bowel gas pattern is unremarkable.  The colon is partially filled with stool; no abnormal dilatation of small bowel loops is seen to suggest small bowel obstruction.  No free intra-abdominal air is identified on the provided  upright view.  The visualized osseous structures are within normal limits; the sacroiliac joints are unremarkable in appearance.  The visualized lung bases are essentially clear.  IMPRESSION: Unremarkable bowel gas pattern; no free intra-abdominal air seen. Colon partially filled with stool, without significant constipation.  Original Report Authenticated By: Tonia Ghent, M.D.     No diagnosis found. 1. Abdominal pain 2. Constipation    MDM  She presents with discomfort for greater than one month that is predominantly resolved at this point, and a history of constipation and stool seen on abdominal plain film. She denies vaginal symptoms or dysuria - pelvic exam deferred. Discussed GI follow up for persistent symptoms.        Rodena Medin, PA-C 12/18/11 0149

## 2011-12-18 NOTE — Discharge Instructions (Signed)
CONTINUE THE DIETARY FIBER SUPPLEMENT AND INCLUDE BENEFIBER OR METAMUCIL AS WELL. PUSH FLUIDS. FOLLOW UP WITH DR. Leone Payor OF GASTROENTEROLOGY FOR FURTHER EVALUATION OF PERSISTENT ABDOMINAL PAIN . RETURN HERE WITH ANY WORSENING SYMPTOMS OR NEW CONCERNS.  Abdominal Pain Abdominal pain can be caused by many things. Your caregiver decides the seriousness of your pain by an examination and possibly blood tests and X-rays. Many cases can be observed and treated at home. Most abdominal pain is not caused by a disease and will probably improve without treatment. However, in many cases, more time must pass before a clear cause of the pain can be found. Before that point, it may not be known if you need more testing, or if hospitalization or surgery is needed. HOME CARE INSTRUCTIONS   Do not take laxatives unless directed by your caregiver.   Take pain medicine only as directed by your caregiver.   Only take over-the-counter or prescription medicines for pain, discomfort, or fever as directed by your caregiver.   Try a clear liquid diet (broth, tea, or water) for as long as directed by your caregiver. Slowly move to a bland diet as tolerated.  SEEK IMMEDIATE MEDICAL CARE IF:   The pain does not go away.   You have a fever.   You keep throwing up (vomiting).   The pain is felt only in portions of the abdomen. Pain in the right side could possibly be appendicitis. In an adult, pain in the left lower portion of the abdomen could be colitis or diverticulitis.   You pass bloody or black tarry stools.  MAKE SURE YOU:   Understand these instructions.   Will watch your condition.   Will get help right away if you are not doing well or get worse.  Document Released: 06/03/2005 Document Revised: 08/13/2011 Document Reviewed: 04/11/2008 [facility] Patient Information 2012 [place], [place].  Constipation in Adults Constipation is having fewer than 2 bowel movements per week. Usually, the stools are hard.  As we grow older, constipation is more common. If you try to fix constipation with laxatives, the problem may get worse. This is because laxatives taken over a long period of time make the colon muscles weaker. A low-fiber diet, not taking in enough fluids, and taking some medicines may make these problems worse. MEDICATIONS THAT MAY CAUSE CONSTIPATION  Water pills (diuretics).   Calcium channel blockers (used to control blood pressure and for the heart).   Certain pain medicines (narcotics).   Anticholinergics.   Anti-inflammatory agents.   Antacids that contain aluminum.  DISEASES THAT CONTRIBUTE TO CONSTIPATION  Diabetes.   Parkinson's disease.   Dementia.   Stroke.   Depression.   Illnesses that cause problems with salt and water metabolism.  HOME CARE INSTRUCTIONS   Constipation is usually best cared for without medicines. Increasing dietary fiber and eating more fruits and vegetables is the best way to manage constipation.   Slowly increase fiber intake to 25 to 38 grams per day. Whole grains, fruits, vegetables, and legumes are good sources of fiber. A dietitian can further help you incorporate high-fiber foods into your diet.   Drink enough water and fluids to keep your urine clear or pale yellow.   A fiber supplement may be added to your diet if you cannot get enough fiber from foods.   Increasing your activities also helps improve regularity.   Suppositories, as suggested by your caregiver, will also help. If you are using antacids, such as aluminum or calcium containing products, it  will be helpful to switch to products containing magnesium if your caregiver says it is okay.   If you have been given a liquid injection (enema) today, this is only a temporary measure. It should not be relied on for treatment of longstanding (chronic) constipation.   Stronger measures, such as magnesium sulfate, should be avoided if possible. This may cause uncontrollable diarrhea.  Using magnesium sulfate may not allow you time to make it to the bathroom.  SEEK IMMEDIATE MEDICAL CARE IF:   There is bright red blood in the stool.   The constipation stays for more than 4 days.   There is belly (abdominal) or rectal pain.   You do not seem to be getting better.   You have any questions or concerns.  MAKE SURE YOU:   Understand these instructions.   Will watch your condition.   Will get help right away if you are not doing well or get worse.  Document Released: 05/22/2004 Document Revised: 08/13/2011 Document Reviewed: 07/28/2011 [facility] Patient Information 2012 [place], [place].

## 2011-12-18 NOTE — ED Notes (Signed)
Pt informed of need for urine specimen. Pt unable to provide one at this time. RN notified. Will continue to monitor.

## 2011-12-19 ENCOUNTER — Emergency Department (HOSPITAL_COMMUNITY)
Admission: EM | Admit: 2011-12-19 | Discharge: 2011-12-20 | Disposition: A | Discharge status: Home / Self Care | Payer: 59 | Attending: Emergency Medicine | Admitting: Emergency Medicine

## 2011-12-19 ENCOUNTER — Encounter (HOSPITAL_COMMUNITY): Payer: Self-pay | Admitting: Emergency Medicine

## 2011-12-19 DIAGNOSIS — E119 Type 2 diabetes mellitus without complications: Secondary | ICD-10-CM | POA: Insufficient documentation

## 2011-12-19 DIAGNOSIS — Z794 Long term (current) use of insulin: Secondary | ICD-10-CM | POA: Insufficient documentation

## 2011-12-19 DIAGNOSIS — R112 Nausea with vomiting, unspecified: Secondary | ICD-10-CM | POA: Insufficient documentation

## 2011-12-19 DIAGNOSIS — R634 Abnormal weight loss: Secondary | ICD-10-CM | POA: Insufficient documentation

## 2011-12-19 DIAGNOSIS — R109 Unspecified abdominal pain: Secondary | ICD-10-CM | POA: Insufficient documentation

## 2011-12-19 DIAGNOSIS — R739 Hyperglycemia, unspecified: Secondary | ICD-10-CM

## 2011-12-19 DIAGNOSIS — R630 Anorexia: Secondary | ICD-10-CM | POA: Insufficient documentation

## 2011-12-19 DIAGNOSIS — K5900 Constipation, unspecified: Secondary | ICD-10-CM | POA: Insufficient documentation

## 2011-12-19 DIAGNOSIS — R231 Pallor: Secondary | ICD-10-CM | POA: Insufficient documentation

## 2011-12-19 NOTE — ED Notes (Signed)
Pt c/o abd pain x 1 month, pt seen multiple times for same. Pt c/o n/v and episode of diarrhea last night.

## 2011-12-19 NOTE — ED Provider Notes (Signed)
Medical screening examination/treatment/procedure(s) were performed by non-physician practitioner and as supervising physician I was immediately available for consultation/collaboration.   Sunnie Nielsen, MD 12/19/11 [phone]

## 2011-12-19 NOTE — ED Notes (Signed)
Per mother pt not eating, not taking insulin

## 2011-12-20 ENCOUNTER — Emergency Department (HOSPITAL_COMMUNITY): Payer: 59

## 2011-12-20 LAB — CBC
HCT: 34.1 % — ABNORMAL LOW (ref 36.0–46.0)
MCHC: 34.9 g/dL (ref 30.0–36.0)
MCV: 83.8 fL (ref 78.0–100.0)
RDW: 11.4 % — ABNORMAL LOW (ref 11.5–15.5)
WBC: 5.1 10*3/uL (ref 4.0–10.5)

## 2011-12-20 LAB — BASIC METABOLIC PANEL
BUN: 8 mg/dL (ref 6–23)
CO2: 24 mEq/L (ref 19–32)
Calcium: 9.2 mg/dL (ref 8.4–10.5)
Creatinine, Ser: 0.41 mg/dL — ABNORMAL LOW (ref 0.50–1.10)
Glucose, Bld: 211 mg/dL — ABNORMAL HIGH (ref 70–99)

## 2011-12-20 LAB — DIFFERENTIAL
Basophils Absolute: 0 10*3/uL (ref 0.0–0.1)
Eosinophils Relative: 2 % (ref 0–5)
Lymphocytes Relative: 35 % (ref 12–46)
Monocytes Absolute: 0.6 10*3/uL (ref 0.1–1.0)

## 2011-12-20 MED ORDER — METOCLOPRAMIDE HCL 5 MG/ML IJ SOLN
10.0000 mg | Freq: Once | INTRAMUSCULAR | Status: AC
  Administered 2011-12-20: 10 mg via INTRAVENOUS
  Filled 2011-12-20: qty 2

## 2011-12-20 MED ORDER — INSULIN ASPART 100 UNIT/ML ~~LOC~~ SOLN
2.0000 [IU] | Freq: Once | SUBCUTANEOUS | Status: AC
  Administered 2011-12-20: 2 [IU] via SUBCUTANEOUS

## 2011-12-20 MED ORDER — ONDANSETRON HCL 4 MG/2ML IJ SOLN
4.0000 mg | Freq: Once | INTRAMUSCULAR | Status: AC
Start: 2011-12-20 — End: 2011-12-20
  Administered 2011-12-20: 4 mg via INTRAVENOUS
  Filled 2011-12-20: qty 2

## 2011-12-20 MED ORDER — INSULIN REGULAR HUMAN 100 UNIT/ML IJ SOLN: 5.0000 [IU] | Freq: Once | INTRAMUSCULAR | Status: DC

## 2011-12-20 MED ORDER — INSULIN ASPART 100 UNIT/ML ~~LOC~~ SOLN
5.0000 [IU] | Freq: Once | SUBCUTANEOUS | Status: DC
  Filled 2011-12-20: qty 1

## 2011-12-20 MED ORDER — MAGNESIUM CITRATE PO SOLN
1.0000 | Freq: Once | ORAL | Status: AC
  Administered 2011-12-20: 1 via ORAL
  Filled 2011-12-20: qty 296

## 2011-12-20 MED ORDER — POLYETHYLENE GLYCOL 3350 17 GM/SCOOP PO POWD: 17.0000 g | Freq: Every day | ORAL | Status: AC

## 2011-12-20 MED ORDER — METOCLOPRAMIDE HCL 10 MG PO TABS: 10.0000 mg | ORAL_TABLET | Freq: Four times a day (QID) | ORAL | Status: DC | PRN

## 2011-12-20 MED ORDER — SODIUM CHLORIDE 0.9 % IV BOLUS (SEPSIS)
1000.0000 mL | Freq: Once | INTRAVENOUS | Status: AC
  Administered 2011-12-20: 1000 mL via INTRAVENOUS

## 2011-12-20 NOTE — ED Provider Notes (Signed)
Medical screening examination/treatment/procedure(s) were performed by non-physician practitioner and as supervising physician I was immediately available for consultation/collaboration.  Malvern Kadlec K Tedric Leeth-Rasch, MD 12/20/11 2320 

## 2011-12-20 NOTE — Discharge Instructions (Signed)
Your lab tests and x-ray did not show any concerning signs to explain your symptoms. Providers today feel your symptoms continue to be caused from constipation. Continue his medications you have been taking at home. It is also recommended that you begin taking MiraLAX. Began by taking 2 cap fulls in an 8 ounce glass of water 3 times a day. Please followup with the primary care provider or GI specialist. Your blood sugar level was also elevated today. Please continue your insulin as instructed by your doctors.  Constipation in Adults Constipation is having fewer than 2 bowel movements per week. Usually, the stools are hard. As we grow older, constipation is more common. If you try to fix constipation with laxatives, the problem may get worse. This is because laxatives taken over a long period of time make the colon muscles weaker. A low-fiber diet, not taking in enough fluids, and taking some medicines may make these problems worse. MEDICATIONS THAT MAY CAUSE CONSTIPATION  Water pills (diuretics).   Calcium channel blockers (used to control blood pressure and for the heart).   Certain pain medicines (narcotics).   Anticholinergics.   Anti-inflammatory agents.   Antacids that contain aluminum.  DISEASES THAT CONTRIBUTE TO CONSTIPATION  Diabetes.   Parkinson's disease.   Dementia.   Stroke.   Depression.   Illnesses that cause problems with salt and water metabolism.  HOME CARE INSTRUCTIONS   Constipation is usually best cared for without medicines. Increasing dietary fiber and eating more fruits and vegetables is the best way to manage constipation.   Slowly increase fiber intake to 25 to 38 grams per day. Whole grains, fruits, vegetables, and legumes are good sources of fiber. A dietitian can further help you incorporate high-fiber foods into your diet.   Drink enough water and fluids to keep your urine clear or pale yellow.   A fiber supplement may be added to your diet if you  cannot get enough fiber from foods.   Increasing your activities also helps improve regularity.   Suppositories, as suggested by your caregiver, will also help. If you are using antacids, such as aluminum or calcium containing products, it will be helpful to switch to products containing magnesium if your caregiver says it is okay.   If you have been given a liquid injection (enema) today, this is only a temporary measure. It should not be relied on for treatment of longstanding (chronic) constipation.   Stronger measures, such as magnesium sulfate, should be avoided if possible. This may cause uncontrollable diarrhea. Using magnesium sulfate may not allow you time to make it to the bathroom.  SEEK IMMEDIATE MEDICAL CARE IF:   There is bright red blood in the stool.   The constipation stays for more than 4 days.   There is belly (abdominal) or rectal pain.   You do not seem to be getting better.   You have any questions or concerns.  MAKE SURE YOU:   Understand these instructions.   Will watch your condition.   Will get help right away if you are not doing well or get worse.  Document Released: 05/22/2004 Document Revised: 08/13/2011 Document Reviewed: 07/28/2011 [facility] Patient Information 2012 [place], [place].  Abdominal Pain Abdominal pain can be caused by many things. Your caregiver decides the seriousness of your pain by an examination and possibly blood tests and X-rays. Many cases can be observed and treated at home. Most abdominal pain is not caused by a disease and will probably improve without treatment. However,  in many cases, more time must pass before a clear cause of the pain can be found. Before that point, it may not be known if you need more testing, or if hospitalization or surgery is needed. HOME CARE INSTRUCTIONS   Do not take laxatives unless directed by your caregiver.   Take pain medicine only as directed by your caregiver.   Only take over-the-counter or  prescription medicines for pain, discomfort, or fever as directed by your caregiver.   Try a clear liquid diet (broth, tea, or water) for as long as directed by your caregiver. Slowly move to a bland diet as tolerated.  SEEK IMMEDIATE MEDICAL CARE IF:   The pain does not go away.   You have a fever.   You keep throwing up (vomiting).   The pain is felt only in portions of the abdomen. Pain in the right side could possibly be appendicitis. In an adult, pain in the left lower portion of the abdomen could be colitis or diverticulitis.   You pass bloody or black tarry stools.  MAKE SURE YOU:   Understand these instructions.   Will watch your condition.   Will get help right away if you are not doing well or get worse.  Document Released: 06/03/2005 Document Revised: 08/13/2011 Document Reviewed: 04/11/2008 [facility] Patient Information 2012 [place], [place].  RESOURCE GUIDE  Dental Problems  Patients with Medicaid: [facility] [phone]                                           [phone] Phone:  [phone]                                                  Phone:  [phone]  If unable to pay or uninsured, contact:  [facility] or [facility]. to become qualified for the adult dental clinic.  Chronic Pain Problems Contact [facility]  [phone] Patients need to be referred by their primary care doctor.  Insufficient Money for Medicine Contact [employer]:  call "211" or [facility] [phone].  No Primary Care Doctor Call [facility]  [phone] Other agencies that provide inexpensive medical care    [facility]  [phone]    [facility]  [phone]    [facility]  [phone]    [facility]  [phone]    [facility]  5708876128    [facility]  [phone]  Psychological Services [facility]   [phone] [facility] Services  [phone] [facility]   [phone] (emergency services [phone])  Substance Abuse Resources [facility]  [phone] [facility] [phone] [facility] [phone] Floydene Flock [phone] Residential & Outpatient Substance Abuse Program  [phone]  Abuse/Neglect [facility] Child Abuse Hotline 423-828-0344 [facility] Child Abuse Hotline [phone] (After Hours)  Emergency Shelter [facility] [phone]  Maternity Homes Room at the [place] [facility] [place] [phone] [facility] Services [phone]  MRSA Hotline #:   (778)602-5153  [facility] Dept. [address]      [place] Phone:  [phone]                                   Phone:  [phone]                 Phone:  [place] Phone:  [place] [phone] (After Hours)

## 2011-12-20 NOTE — ED Provider Notes (Signed)
History     CSN: [id number]  Arrival date & time 12/19/11  2123   First MD Initiated Contact with Patient 12/20/11 0047      Chief Complaint  Patient presents with  . Abdominal Pain     HPI  History provided by the patient and family. Patient is a 21 year old female with past history of diabetes who presents with complaints of persistent abdominal cramping and constipation. Patient reports symptoms began one month ago and have been persistent off and on since that time. Patient has multiple visits to the emergency room in the past month for similar complaints. Symptoms are described as severe. Patient has taken 3 doses of laxatives in the last 2 days with a small bowel movement last night. Patient has not used any other treatments for her symptoms. Patient was also given GI followup but has not made an appointment. Symptoms are associated with occasional episodes of nausea and vomiting yesterday. Patient also reports generalized decreased appetite with 10 pound weight loss over the past month. Patient denies any fever, chills, sweats.    Past Medical History  Diagnosis Date  . Diabetes mellitus     History reviewed. No pertinent past surgical history.  No family history on file.  History  Substance Use Topics  . Smoking status: Never Smoker   . Smokeless tobacco: Not on file  . Alcohol Use: No    OB History    Grav Para Term Preterm Abortions TAB SAB Ect Mult Living                  Review of Systems  Constitutional: Positive for appetite change. Negative for fever and chills.  Respiratory: Negative for shortness of breath.   Cardiovascular: Negative for chest pain.  Gastrointestinal: Positive for nausea, vomiting, abdominal pain and constipation. Negative for diarrhea.  Genitourinary: Negative for dysuria, frequency, hematuria, flank pain and decreased urine volume.  Skin: Negative for rash.    Allergies  Review of patient's allergies indicates no known  allergies.  Home Medications   Current Outpatient Rx  Name Route Sig Dispense Refill  . INSULIN GLARGINE 100 UNIT/ML [place] SOLN Subcutaneous Inject 23 Units into the skin at bedtime.     . INSULIN LISPRO (HUMAN) 100 UNIT/ML [place] SOLN Subcutaneous Inject 0-10 Units into the skin 3 (three) times daily before meals. Sliding scale insulin; takes 1 unit of insulin per 40 score over 150 base CBG    . OXYCODONE-ACETAMINOPHEN 5-325 MG PO TABS Oral Take 1 tablet by mouth every 4 (four) hours as needed.      BP 102/78  Pulse 128  Temp(Src) 98.7 F (37.1 C) (Oral)  Resp 20  SpO2 100%  LMP 12/12/2011  Physical Exam  Nursing note and vitals reviewed. Constitutional: She is oriented to person, place, and time. She appears well-developed and well-nourished. No distress.  HENT:  Head: Normocephalic and atraumatic.  Neck: Normal range of motion. Neck supple.       No meningeal sign  Cardiovascular: Normal rate and regular rhythm.   Pulmonary/Chest: Effort normal and breath sounds normal. No respiratory distress. She has no rales.  Abdominal: Soft. Bowel sounds are normal. She exhibits no distension. There is tenderness. There is no rebound and no guarding.       Mild diffuse tenderness  Neurological: She is alert and oriented to person, place, and time.  Skin: Skin is warm and dry. No rash noted. There is pallor.  Psychiatric: She has a normal mood and affect.  Her behavior is normal.    ED Course  Procedures   Results for orders placed during the hospital encounter of 12/19/11  GLUCOSE, CAPILLARY      Component Value Range   Glucose-Capillary 194 (*) 70 - 99 (mg/dL)  CBC      Component Value Range   WBC 5.1  4.0 - 10.5 (K/uL)   RBC 4.07  3.87 - 5.11 (MIL/uL)   Hemoglobin 11.9 (*) 12.0 - 15.0 (g/dL)   HCT 16.1 (*) 09.6 - 46.0 (%)   MCV 83.8  78.0 - 100.0 (fL)   MCH 29.2  26.0 - 34.0 (pg)   MCHC 34.9  30.0 - 36.0 (g/dL)   RDW 04.5 (*) 40.9 - 15.5 (%)   Platelets 292  150 - 400 (K/uL)   DIFFERENTIAL      Component Value Range   Neutrophils Relative 51  43 - 77 (%)   Neutro Abs 2.6  1.7 - 7.7 (K/uL)   Lymphocytes Relative 35  12 - 46 (%)   Lymphs Abs 1.8  0.7 - 4.0 (K/uL)   Monocytes Relative 12  3 - 12 (%)   Monocytes Absolute 0.6  0.1 - 1.0 (K/uL)   Eosinophils Relative 2  0 - 5 (%)   Eosinophils Absolute 0.1  0.0 - 0.7 (K/uL)   Basophils Relative 0  0 - 1 (%)   Basophils Absolute 0.0  0.0 - 0.1 (K/uL)  BASIC METABOLIC PANEL      Component Value Range   Sodium 133 (*) 135 - 145 (mEq/L)   Potassium 3.8  3.5 - 5.1 (mEq/L)   Chloride 98  96 - 112 (mEq/L)   CO2 24  19 - 32 (mEq/L)   Glucose, Bld 211 (*) 70 - 99 (mg/dL)   BUN 8  6 - 23 (mg/dL)   Creatinine, Ser 8.11 (*) 0.50 - 1.10 (mg/dL)   Calcium 9.2  8.4 - 91.4 (mg/dL)   GFR calc non Af Amer >90  >90 (mL/min)   GFR calc Af Amer >90  >90 (mL/min)       Dg Abd 1 View  12/20/2011  *RADIOLOGY REPORT*  Clinical Data: Abdominal pain.  Nausea.  ABDOMEN - 1 VIEW  Comparison:  12/18/2011  Findings: The bowel gas pattern is normal.  No radio-opaque calculi or other significant radiographic abnormality is seen. Decreased colonic stool seen compared to prior exam.  IMPRESSION:  No acute findings.  Original Report Authenticated By: Danae Orleans, M.D.     1. Constipation   2. Hyperglycemia   3. Abdominal pain       MDM  1:00 AM patient seen and evaluated. Patient no acute distress.  Pt feeling better after IV fluids.  Labs unremarkable aside from elevated blood sugar.  Sub q insulin given.  Pt with improved HR at rest around 98bpm, still slightly tachy with activity.  Pt states she feels ready to return home.      Angus Seller, [place] 12/20/11 1535

## 2011-12-21 ENCOUNTER — Encounter: Payer: Self-pay | Admitting: Gastroenterology

## 2011-12-23 ENCOUNTER — Telehealth: Payer: Self-pay | Admitting: Gastroenterology

## 2011-12-23 NOTE — Telephone Encounter (Signed)
Patient is rescheduled to see Mike Gip PA tomorrow at 9:30

## 2011-12-24 ENCOUNTER — Other Ambulatory Visit (INDEPENDENT_AMBULATORY_CARE_PROVIDER_SITE_OTHER): Payer: 59

## 2011-12-24 ENCOUNTER — Ambulatory Visit (INDEPENDENT_AMBULATORY_CARE_PROVIDER_SITE_OTHER): Payer: 59 | Admitting: Physician Assistant

## 2011-12-24 ENCOUNTER — Encounter: Payer: Self-pay | Admitting: Physician Assistant

## 2011-12-24 VITALS — BP 84/72 | HR 108 | Ht 65.0 in | Wt 110.1 lb

## 2011-12-24 DIAGNOSIS — M549 Dorsalgia, unspecified: Secondary | ICD-10-CM

## 2011-12-24 DIAGNOSIS — F419 Anxiety disorder, unspecified: Secondary | ICD-10-CM

## 2011-12-24 DIAGNOSIS — R1084 Generalized abdominal pain: Secondary | ICD-10-CM

## 2011-12-24 DIAGNOSIS — R110 Nausea: Secondary | ICD-10-CM

## 2011-12-24 DIAGNOSIS — M546 Pain in thoracic spine: Secondary | ICD-10-CM

## 2011-12-24 DIAGNOSIS — R634 Abnormal weight loss: Secondary | ICD-10-CM

## 2011-12-24 DIAGNOSIS — F411 Generalized anxiety disorder: Secondary | ICD-10-CM

## 2011-12-24 LAB — IBC PANEL: Iron: 74 ug/dL (ref 42–145)

## 2011-12-24 MED ORDER — METOCLOPRAMIDE HCL 10 MG PO TABS: ORAL_TABLET | ORAL | Status: DC

## 2011-12-24 NOTE — Progress Notes (Addendum)
Subjective:    Patient ID: Chelsea Sparks, female    DOB: 02-02-1991, 21 y.o.   MRN: [id number]  HPI Chelsea Sparks is a pleasant 21 year old white female due to our practice today with history of type 1 diabetes diagnosed at age 21 She comes in today for evaluation of abdominal pain nausea and weight loss. She has had a couple of ER visits this year with complaints of abdominal pain and was last seen on 12/20/2011 in the ER. She did have plain abdominal films done at that time which were negative and a WBC of 5.1 hemoglobin 11.9 hematocrit of 34.1. Glucose was 194. She was started on MiraLax at that time for constipation.  Patient describes symptoms onset 4-6 weeks ago with nausea and says that she has had ongoing problems with nausea ever since and feels that everything that she eats or  drinks sits in her stomach for a long time. She has had a weight loss of about 10 pounds over that period of time. Her appetite has been decreased and she is very occasionally been vomiting. He is also been having a difficult time with bowel movements which is new for her and has been having a bowel movement about once or twice per week and feels that she's only passing small amounts of stool with a lot of straining. Since starting on MiraLax over the past few days she is having more regular bowel movements. She has not noted any melena or hematochezia.  She also has been having ongoing issues with abdominal pain that she says is intermittent and is always worse within 5-10 minutes of eating. She feels the pain in her mid abdomen and more so into her right lower abdomen and around into her right back.She says this pain is so bad at times it  brings her to to tears. She describes it as crampy and achy. She has not had any fever or chills.  Patient has never had any previous GI workup, and review of EPIC shows no prior CT scans etc. She is not using any regular aspirin or NSAIDs.    Review of Systems  Constitutional:  Positive for appetite change and unexpected weight change.  HENT: Negative.   Eyes: Negative.   Respiratory: Negative.   Cardiovascular: Negative.   Gastrointestinal: Positive for nausea, vomiting, abdominal pain and constipation.  Genitourinary: Negative.   Musculoskeletal: Negative.   Skin: Negative.   Neurological: Negative.   Hematological: Negative.   Psychiatric/Behavioral: The patient is nervous/anxious.    Outpatient Prescriptions Prior to Visit  Medication Sig Dispense Refill  . insulin glargine (LANTUS) 100 UNIT/ML injection Inject 23 Units into the skin at bedtime.       . insulin lispro (HUMALOG) 100 UNIT/ML injection Inject 0-10 Units into the skin 3 (three) times daily before meals. Sliding scale insulin; takes 1 unit of insulin per 40 score over 150 base CBG      . oxyCODONE-acetaminophen (PERCOCET) 5-325 MG per tablet Take 1 tablet by mouth every 4 (four) hours as needed.      . metoCLOPramide (REGLAN) 10 MG tablet Take 1 tablet (10 mg total) by mouth every 6 (six) hours as needed (nausea/headache).  10 tablet  0    No Known Allergies     Patient Active Problem List  Diagnoses  . Type 1 diabetes mellitus  . Anxiety    Objective:   Physical Exam well-developed thin pale young white female in no acute distress, pleasant accompanied by her mother. Blood pressure  84/72 pulse 100 height 5 foot 5 weight 110. HEENT nontraumatic normocephalic EOMI PERRLA sclera anicteric oropharynx clear, Cardiovascular; regular rate and rhythm slightly tachycardia no murmur rub or gallop, Pulmonary; clear bilaterally, Abdomen; soft, she has mild rather generalized tenderness and more marked tenderness in the right mid quadrant right lower quadrant no guarding no rebound no palpable mass or hepatosplenomegaly, Rectal;exam not done, Extremities; thin, no clubbing, cyanosis or edema skin is warm and dry, Psych; anxious but mood and affect otherwise appropriate        Assessment & Plan:  #49  21 year old female with type 1 diabetes with 4-6 week history of nausea and early satiety associated with 10 pound weight loss. She has also been having fairly generalized abdominal pain cramping, constipation and more focal pain in her right lower quadrant.  Certainly some of her symptoms sound like gastroparesis, however does this does not explain her constipation and right lower quadrant pain. Will need to rule out other intra-abdominal inflammatory process/IBD. #2 anemia normocytic rule out iron deficiency #3 anxiety  Plan; Will check iron studies today Start gastroparesis diet and this was discussed with patient and her mother Scheduled for CT scan of the abdomen and pelvis 2 new MiraLax 17 g every day as this does seem to be helping Patient had previously been given a prescription for Reglan 10 mg before meals and have asked her to try this over the next couple of weeks until her workup is complete. Followup with Dr. Rhea Belton in 2 weeks.  Addendum: Reviewed and agree with management. CT unremarkable.  Ovarian cyst, which is likely physiologic Agree with close followup

## 2011-12-24 NOTE — Patient Instructions (Signed)
Please go to the basement level to have your labs drawn.   Take Miralax once daily. We sent refills on the Reglan 10 mg to take 1 tab 30 min between meals. We scheduled CT scan.  You have been scheduled for a CT scan of the abdomen and pelvis at [facility] ([address]---this is in the same building as Architectural technologist).   You are scheduled on Friday 4-19 at 9:00 AM . You should arrive at 8:45Am  prior to your appointment time for registration. Please follow the written instructions below on the day of your exam:  WARNING: IF YOU ARE ALLERGIC TO IODINE/X-RAY DYE, PLEASE NOTIFY RADIOLOGY IMMEDIATELY AT [phone]! YOU WILL BE GIVEN A 13 HOUR PREMEDICATION PREP.  1) Do not eat or drink anything after 5:00 Pm  (4 hours prior to your test) 2) You have been given 2 bottles of oral contrast to drink. The solution may taste better if refrigerated, but do NOT add ice or any other liquid to this solution. Shake well before drinking.    Drink 1 bottle of contrast @:7:00 Am (2 hours prior to your exam)  Drink 1 bottle of contrast @ 8:00 AM (1 hour prior to your exam)  You may take any medications as prescribed with a small amount of water except for the following: Metformin, Glucophage, Glucovance, Avandamet, Riomet, Fortamet, Actoplus Met, Janumet, Glumetza or Metaglip. The above medications must be held the day of the exam AND 48 hours after the exam.  The purpose of you drinking the oral contrast is to aid in the visualization of your intestinal tract. The contrast solution may cause some diarrhea. Before your exam is started, you will be given a small amount of fluid to drink. Depending on your individual set of symptoms, you may also receive an intravenous injection of x-ray contrast/dye. Plan on being at [facility] for 30 minutes or long, depending on the type of exam you are having performed.  If you have any questions regarding your exam or if you need to reschedule,  you may call the CT department at [phone] between the hours of 8:00 am and 5:00 pm, Monday-Friday.  ________________________________________________________________________

## 2011-12-25 ENCOUNTER — Telehealth: Payer: Self-pay | Admitting: Physician Assistant

## 2011-12-25 ENCOUNTER — Ambulatory Visit (INDEPENDENT_AMBULATORY_CARE_PROVIDER_SITE_OTHER)
Admission: RE | Admit: 2011-12-25 | Discharge: 2011-12-25 | Disposition: A | Discharge status: Home / Self Care | Payer: 59 | Source: Ambulatory Visit | Attending: Cardiology | Admitting: Cardiology

## 2011-12-25 DIAGNOSIS — M546 Pain in thoracic spine: Secondary | ICD-10-CM

## 2011-12-25 DIAGNOSIS — R634 Abnormal weight loss: Secondary | ICD-10-CM

## 2011-12-25 DIAGNOSIS — R1084 Generalized abdominal pain: Secondary | ICD-10-CM

## 2011-12-25 DIAGNOSIS — M549 Dorsalgia, unspecified: Secondary | ICD-10-CM

## 2011-12-25 MED ORDER — IOHEXOL 300 MG/ML  SOLN
80.0000 mL | Freq: Once | INTRAMUSCULAR | Status: AC | PRN
  Administered 2011-12-25: 80 mL via INTRAVENOUS

## 2011-12-25 NOTE — Telephone Encounter (Signed)
The patient had called to ask about the CT scan results.  I reviewed the CT scan with Dr Rhea Belton and he suggested the CT scan was unremarkable.  He said she should follow Chelsea Sparks's plan of taking the Reglan and Miralax .  I made her an appt to follow up with Dr. Rhea Belton on 12-31-2011.  The next one available was 5-15 and she didn't want to wait that long.

## 2011-12-30 ENCOUNTER — Inpatient Hospital Stay (HOSPITAL_COMMUNITY)
Admission: AD | Admit: 2011-12-30 | Discharge: 2011-12-31 | Disposition: A | Discharge status: Home / Self Care | Payer: 59 | Source: Ambulatory Visit | Attending: Obstetrics and Gynecology | Admitting: Obstetrics and Gynecology

## 2011-12-30 DIAGNOSIS — R1032 Left lower quadrant pain: Secondary | ICD-10-CM | POA: Insufficient documentation

## 2011-12-30 DIAGNOSIS — N83209 Unspecified ovarian cyst, unspecified side: Secondary | ICD-10-CM

## 2011-12-30 NOTE — MAU Note (Signed)
Pt having left side pain that is increasing.  CT scan last week showed an ovarian cyst.  Pt has follow up appt 4/25.  LMP 4/6.

## 2011-12-31 ENCOUNTER — Ambulatory Visit (INDEPENDENT_AMBULATORY_CARE_PROVIDER_SITE_OTHER): Payer: 59 | Admitting: Internal Medicine

## 2011-12-31 ENCOUNTER — Encounter (HOSPITAL_COMMUNITY): Payer: Self-pay | Admitting: *Deleted

## 2011-12-31 ENCOUNTER — Encounter: Payer: Self-pay | Admitting: Internal Medicine

## 2011-12-31 ENCOUNTER — Inpatient Hospital Stay (HOSPITAL_COMMUNITY): Payer: 59

## 2011-12-31 ENCOUNTER — Other Ambulatory Visit (INDEPENDENT_AMBULATORY_CARE_PROVIDER_SITE_OTHER): Payer: 59

## 2011-12-31 VITALS — BP 98/66 | HR 68 | Ht 65.0 in | Wt 110.6 lb

## 2011-12-31 DIAGNOSIS — D649 Anemia, unspecified: Secondary | ICD-10-CM

## 2011-12-31 DIAGNOSIS — N83209 Unspecified ovarian cyst, unspecified side: Secondary | ICD-10-CM

## 2011-12-31 DIAGNOSIS — K5900 Constipation, unspecified: Secondary | ICD-10-CM

## 2011-12-31 DIAGNOSIS — K3184 Gastroparesis: Secondary | ICD-10-CM

## 2011-12-31 DIAGNOSIS — E1349 Other specified diabetes mellitus with other diabetic neurological complication: Secondary | ICD-10-CM

## 2011-12-31 DIAGNOSIS — E1343 Other specified diabetes mellitus with diabetic autonomic (poly)neuropathy: Secondary | ICD-10-CM

## 2011-12-31 DIAGNOSIS — E1142 Type 2 diabetes mellitus with diabetic polyneuropathy: Secondary | ICD-10-CM

## 2011-12-31 LAB — CBC WITH DIFFERENTIAL/PLATELET
Basophils Relative: 0.5 % (ref 0.0–3.0)
Eosinophils Absolute: 0.1 10*3/uL (ref 0.0–0.7)
Eosinophils Relative: 1.1 % (ref 0.0–5.0)
Hemoglobin: 12.7 g/dL (ref 12.0–15.0)
Lymphocytes Relative: 33 % (ref 12.0–46.0)
MCHC: 33 g/dL (ref 30.0–36.0)
MCV: 88.2 fl (ref 78.0–100.0)
Monocytes Absolute: 0.7 10*3/uL (ref 0.1–1.0)
Neutro Abs: 3.1 10*3/uL (ref 1.4–7.7)
Neutrophils Relative %: 53.8 % (ref 43.0–77.0)
RBC: 4.37 Mil/uL (ref 3.87–5.11)
WBC: 5.8 10*3/uL (ref 4.5–10.5)

## 2011-12-31 LAB — URINALYSIS, ROUTINE W REFLEX MICROSCOPIC
Glucose, UA: NEGATIVE mg/dL
Hgb urine dipstick: NEGATIVE
Ketones, ur: NEGATIVE mg/dL
Protein, ur: NEGATIVE mg/dL

## 2011-12-31 LAB — POCT PREGNANCY, URINE: Preg Test, Ur: NEGATIVE

## 2011-12-31 MED ORDER — OXYCODONE-ACETAMINOPHEN 5-325 MG PO TABS: 1.0000 | ORAL_TABLET | ORAL | Status: DC | PRN

## 2011-12-31 MED ORDER — METOCLOPRAMIDE HCL 10 MG PO TABS: ORAL_TABLET | ORAL | Status: DC

## 2011-12-31 MED ORDER — IBUPROFEN 800 MG PO TABS: 800.0000 mg | ORAL_TABLET | Freq: Three times a day (TID) | ORAL | Status: AC | PRN

## 2011-12-31 NOTE — Progress Notes (Signed)
Pt became tearful at the  Discussion of  A pelvic exam came up.No pelvic exam

## 2011-12-31 NOTE — Progress Notes (Signed)
Subjective:    Patient ID: Chelsea Sparks, female    DOB: [birth year], 21 y.o.   MRN: [id number]  HPI Chelsea Sparks is a 21 yo female with linear history of type 1 diabetes mellitus who is seen 2 weeks ago by Mike Gip for evaluation of abdominal pain, nausea with weight loss who returns today to follow up with her mother and sister. A CT scan was performed after her last office visit with [place] and this revealed a left ovarian cyst, 3 cm in size. There was no other pathologic finding in the abdomen or cause for upper abdominal pain. After this visit she was advised to start metoclopramide which had been previously prescribed as empiric treatment for possible gastroparesis. She also was advised to continue MiraLAX as she was expressing constipation. She is using metoclopramide 10 mg before breakfast and before dinner (20 mg total dose daily). Today she reports her nausea, vomiting, and upper abdominal pain is significantly better. She is no longer having nausea or vomiting, and her appetite has returned to normal. She states that she now wants to eat, and she is very happy about this fact. She also reports resolved constipation, and she is now using MiraLAX 17 g daily. She is having a soft but formed bowel movement every other day. She has not needed to strain for defecation. She did return to the ER last night for left lower abdominal pain as well as left flank pain. She had an ultrasound of her pelvis which revealed a hemorrhagic left ovarian cyst. This was also seen by CT and felt to be the cause of her abdominal pain. This pain is feeling having troubling her today. She denies fevers, but has noted some chills and has felt cold. No night sweats. Blood sugars have been overall improved, in fact slightly low recently. She is not weighed herself since last visit, therefore she does not know if she started to gain weight back.  Review of Systems As per history of present illness, otherwise  negative  Current Medications, Allergies, Past Medical History, Past Surgical History, Family History and Social History were reviewed in [employer] record.     Objective:   Physical Exam BP 98/66  Pulse 68  Ht 5\' 5"  (1.651 m)  Wt 110 lb 9.6 oz (50.168 kg)  BMI 18.40 kg/m2  LMP 12/12/2011 Constitutional: Well-developed and thin female in no distress. HEENT: Normocephalic and atraumatic. Oropharynx is clear and moist. No oropharyngeal exudate. Conjunctivae are normal. Pupils are equal round and reactive to light. No scleral icterus. Neck: Neck supple. Trachea midline. Cardiovascular: Normal rate, regular rhythm and intact distal pulses. No M/R/G Pulmonary/chest: Effort normal and breath sounds normal. No wheezing, rales or rhonchi. Abdominal: Soft, scaphoid, mild left lower corner and left flank abdominal pain to deep palpation without rebound or guarding, nondistended. Bowel sounds active throughout. There are no masses palpable. No hepatosplenomegaly. Extremities: no clubbing, cyanosis, or edema Lymphadenopathy: No cervical adenopathy noted. Neurological: Alert and oriented to person place and time. Skin: Skin is warm and dry. No rashes noted. Psychiatric: Normal mood and affect. Behavior is normal.  CBC    Component Value Date/Time   WBC 5.1 12/20/2011 0110   WBC 7.2 11/20/2011 1311   RBC 4.07 12/20/2011 0110   RBC 4.70 11/20/2011 1311   HGB 11.9* 12/20/2011 0110   HGB 13.8 11/20/2011 1311   HCT 34.1* 12/20/2011 0110   HCT 41.3 11/20/2011 1311   PLT 292 12/20/2011 0110   MCV  83.8 12/20/2011 0110   MCV 87.8 11/20/2011 1311   MCH 29.2 12/20/2011 0110   MCH 29.4 11/20/2011 1311   MCHC 34.9 12/20/2011 0110   MCHC 33.4 11/20/2011 1311   RDW 11.4* 12/20/2011 0110   LYMPHSABS 1.8 12/20/2011 0110   MONOABS 0.6 12/20/2011 0110   EOSABS 0.1 12/20/2011 0110   BASOSABS 0.0 12/20/2011 0110   Iron/TIBC/Ferritin    Component Value Date/Time   IRON 74 12/24/2011 1053   Transferrin - low  Imaging: CT scan and pelvic US reviewed in chart.    Assessment & Plan:  Chelsea Sparks is a 21 yo female with linear history of type 1 diabetes mellitus who is seen 2 weeks ago by Mike Gip for evaluation of abdominal pain, nausea, constipation with weight loss  1. N/V/anorexia/gastroparesis-- the patient has responded very well to metoclopramide therapy, which helps support the diagnosis of gastroparesis. She has not had a gastric emptying study, but her excellent response to promotility agent likely confirms the diagnosis. It does seem she is able to eat now, and hopefully will be a weight gain back the weight that she has recently lost. We had a long discussion today about metoclopramide therapy, particularly when used chronically.  We discussed how this medication has been associated with tardive dyskinesia and long-term, irreversible neurologic side effects. This is a very rare side effect, but is possible. We discussed how when this therapy is used, it is to be used to the lowest effective dose, for the smallest masses amount of time, and with drug holidays when possible. She voiced understanding. For now, she will continue on this medication, but will try to wean down to 5 mg twice daily. We just need to determine how necessary this medication is for now. It is likely that her gastroparesis will flare, but there may be times when she does not need a promotility agent. If she does need this medication more often than not, and we briefly discussed domperidone. This would likely be a better chronic agent for her if necessary.  2. Constipation -- the patient's constipation has improved with MiraLAX therapy. She will continue 17 g daily.  3. Anemia -- on last CBC 11 days ago, she did have a mild normocytic anemia. This was new over the last 4 weeks. She's had no overt blood loss, and I recommended rechecking this today. This may need further evaluation of her anemia progresses or  does not return to normal.  She will followup in 4 weeks' time in clinic, or sooner if necessary.

## 2011-12-31 NOTE — MAU Provider Note (Signed)
History     CSN: [id number]  Arrival date and time: 12/30/11 2325   None     Chief Complaint  Patient presents with  . Abdominal Pain   HPI 21 y.o. G0P0  with LLQ pain x 1 month. Had CT scan last week showing 3 cm left ovarian cyst. Pain worsening since that time, improves with percocet. Has f/u with GI doctor later today and f/u with GYN doctor in May. No vaginal bleeding or discharge. Denies sexual activity.    Past Medical History  Diagnosis Date  . Diabetes mellitus     Type 1  . Hypertension age 32  . Panic attacks   . Anxiety     Past Surgical History  Procedure Date  . No past surgeries     Family History  Problem Relation Age of Onset  . Diabetes Father   . Diabetes Maternal Grandmother   . Breast cancer Maternal Aunt   . Heart attack Maternal Grandfather     History  Substance Use Topics  . Smoking status: Never Smoker   . Smokeless tobacco: Never Used  . Alcohol Use: No    Allergies: No Known Allergies  Prescriptions prior to admission  Medication Sig Dispense Refill  . insulin glargine (LANTUS) 100 UNIT/ML injection Inject 23 Units into the skin at bedtime.       . insulin lispro (HUMALOG) 100 UNIT/ML injection Inject 0-10 Units into the skin 3 (three) times daily before meals. Sliding scale insulin; takes 1 unit of insulin per 40 score over 150 base CBG      . metoCLOPramide (REGLAN) 10 MG tablet Take 1 tab 30 min between meals.  90 tablet  2  . oxyCODONE-acetaminophen (PERCOCET) 5-325 MG per tablet Take 1 tablet by mouth every 4 (four) hours as needed.      . polyethylene glycol (MIRALAX / GLYCOLAX) packet Take 17 g by mouth daily.        Review of Systems  Constitutional: Negative.   Respiratory: Negative.   Cardiovascular: Negative.   Gastrointestinal: Positive for nausea, vomiting, abdominal pain and constipation. Negative for diarrhea.  Genitourinary: Negative for dysuria, urgency, frequency, hematuria and flank pain.       Negative for  vaginal bleeding, vaginal discharge  Musculoskeletal: Negative.   Neurological: Negative.   Psychiatric/Behavioral: Negative.    Physical Exam   Blood pressure 122/74, pulse 119, temperature 97.9 F (36.6 C), resp. rate 16, height 5\' 5"  (1.651 m), weight 110 lb 12.8 oz (50.259 kg), last menstrual period 12/12/2011.  Physical Exam  Nursing note and vitals reviewed. Constitutional: She is oriented to person, place, and time. She appears well-developed. She has a sickly appearance. No distress.  Cardiovascular: Normal rate.   Respiratory: Effort normal.  GI: Soft. There is no tenderness.  Musculoskeletal: Normal range of motion.  Neurological: She is alert and oriented to person, place, and time.  Skin: Skin is warm and dry.  Psychiatric: She has a normal mood and affect.   Pelvic exam deferred - offered pelvic exam and patient became immediately distressed and tearful, states "I don't like people going down there"  MAU Course  Procedures  Results for orders placed during the hospital encounter of 12/30/11 (from the past 24 hour(s))  URINALYSIS, ROUTINE W REFLEX MICROSCOPIC     Status: Abnormal   Collection Time   12/30/11 11:44 PM      Component Value Range   Color, Urine YELLOW  YELLOW    APPearance CLEAR  CLEAR  Specific Gravity, Urine 1.015  1.005 - 1.030    pH 5.5  5.0 - 8.0    Glucose, UA NEGATIVE  NEGATIVE (mg/dL)   Hgb urine dipstick NEGATIVE  NEGATIVE    Bilirubin Urine NEGATIVE  NEGATIVE    Ketones, ur NEGATIVE  NEGATIVE (mg/dL)   Protein, ur NEGATIVE  NEGATIVE (mg/dL)   Urobilinogen, UA 0.2  0.0 - 1.0 (mg/dL)   Nitrite NEGATIVE  NEGATIVE    Leukocytes, UA SMALL (*) NEGATIVE   URINE MICROSCOPIC-ADD ON     Status: Normal   Collection Time   12/30/11 11:44 PM      Component Value Range   Squamous Epithelial / LPF RARE  RARE    WBC, UA 0-2  <3 (WBC/hpf)  POCT PREGNANCY, URINE     Status: Normal   Collection Time   12/31/11 12:09 AM      Component Value Range    Preg Test, Ur NEGATIVE  NEGATIVE    Dg Abd 1 View  12/20/2011  *RADIOLOGY REPORT*  Clinical Data: Abdominal pain.  Nausea.  ABDOMEN - 1 VIEW  Comparison:  12/18/2011  Findings: The bowel gas pattern is normal.  No radio-opaque calculi or other significant radiographic abnormality is seen. Decreased colonic stool seen compared to prior exam.  IMPRESSION:  No acute findings.  Original Report Authenticated By: Danae Orleans, M.D.   US Pelvis Complete  12/31/2011  *RADIOLOGY REPORT*  Clinical Data: Follow-up left ovarian cyst, abdominal pain  TRANSABDOMINAL ULTRASOUND OF PELVIS  Technique:  Transabdominal ultrasound examination of the pelvis was performed including evaluation of the uterus, ovaries, adnexal regions, and pelvic cul-de-sac.  Comparison:  CT 12/25/2011  The patient refused transvaginal imaging.  This limits sensitivity and specificity of the exam.  Findings:  Uterus:  7.7 x 4.1 x 3.1 cm.  Anteverted, anteflexed.No focal abnormality.  Endometrium: 1.1 cm.  Trilaminar in appearance, normal.  Right ovary: 3.6 x 1.9 x 1.4 cm.  Normal.  Left ovary: 4.5 x 3.1 x 3.0 cm.  2.6 x 2.1 x 1.8 cm probable hemorrhagic ovarian cyst, not optimally visualized with transabdominal only technique.  This is smaller than previously measured.  Other Findings:  Trace free fluid.  IMPRESSION: Probable hemorrhagic left ovarian cyst, smaller than previously. No new acute abnormality.  Original Report Authenticated By: Harrel Lemon, M.D.   Ct Abdomen Pelvis W Contrast  12/25/2011  *RADIOLOGY REPORT*  Clinical Data: Weight loss.  Generalized abdominal pain.  CT ABDOMEN AND PELVIS WITH CONTRAST  Technique:  Multidetector CT imaging of the abdomen and pelvis was performed following the standard protocol during bolus administration of intravenous contrast.  Contrast: 80mL OMNIPAQUE IOHEXOL 300 MG/ML  SOLN  Comparison: Radiography 04/14  Findings: Lung bases are clear.  No pleural or pericardial fluid.  The liver has a normal  appearance without focal lesions or biliary ductal dilatation.  No calcified gallstones.  The spleen is normal. The pancreas is normal.  The adrenal glands are normal.  The kidneys are normal.  No cyst, mass, stone or hydronephrosis.  The aorta and IVC are normal.  No retroperitoneal mass or adenopathy. No free intraperitoneal fluid or air.  No bowel pathology is seen. The uterus appears normal.  The left ovary is slightly prominent, measuring 4.5 x 3.8 by 3.8 cm.  There is probably a dominant cyst on the order of 3 cm in size.  The ovary on the right appears within normal limits measuring 3.7 x 1.9 by 2.5 cm.  No significant bony finding.  IMPRESSION: No pathologic finding the abdominal portion of the scan.  No cause of pain identified.  Left ovary is prominent, probably containing a 3 cm cyst.  This is not optimally evaluated by CT.  Original Report Authenticated By: Thomasenia Sales, M.D.   Dg Abd 2 Views  12/18/2011  *RADIOLOGY REPORT*  Clinical Data: Upper abdominal pain for 1 month; nausea.  ABDOMEN - 2 VIEW  Comparison: Lumbar spine radiographs performed 03/01/2007  Findings: The visualized bowel gas pattern is unremarkable.  The colon is partially filled with stool; no abnormal dilatation of small bowel loops is seen to suggest small bowel obstruction.  No free intra-abdominal air is identified on the provided upright view.  The visualized osseous structures are within normal limits; the sacroiliac joints are unremarkable in appearance.  The visualized lung bases are essentially clear.  IMPRESSION: Unremarkable bowel gas pattern; no free intra-abdominal air seen. Colon partially filled with stool, without significant constipation.  Original Report Authenticated By: Tonia Ghent, M.D.    Assessment and Plan  21 y.o. G0P0 with left ovarian cyst - no significant change since CT on 4/19 Offered OCPs - pt declined Rx Percocet and Motrin for pain Follow up as scheduled with Dr. Normand Sloop on  5/9  Chelsea Sparks 12/31/2011, 12:04 AM

## 2011-12-31 NOTE — Patient Instructions (Signed)
Your physician has requested that you go to the basement for lab work before leaving today.  Continue taking Miralax 17g daily and Phenergan daily trying to decrease your dose.   Follow up with Dr. Rhea Belton in 4 weeks

## 2011-12-31 NOTE — MAU Provider Note (Signed)
Attestation of Attending Supervision of Advanced Practitioner: Evaluation and management procedures were performed by the PA/NP/CNM/OB Fellow under my supervision/collaboration. Chart reviewed and agree with management and plan.  Joannie Medine V 12/31/2011 6:50 AM

## 2012-01-07 ENCOUNTER — Encounter: Payer: Self-pay | Admitting: Endocrinology

## 2012-01-07 ENCOUNTER — Other Ambulatory Visit (INDEPENDENT_AMBULATORY_CARE_PROVIDER_SITE_OTHER): Payer: 59

## 2012-01-07 ENCOUNTER — Ambulatory Visit (INDEPENDENT_AMBULATORY_CARE_PROVIDER_SITE_OTHER): Payer: 59 | Admitting: Endocrinology

## 2012-01-07 ENCOUNTER — Encounter: Payer: Self-pay | Admitting: Family Medicine

## 2012-01-07 ENCOUNTER — Encounter: Payer: 59 | Admitting: Family Medicine

## 2012-01-07 VITALS — BP 102/70 | HR 116 | Temp 98.2°F | Ht 65.0 in | Wt 109.0 lb

## 2012-01-07 DIAGNOSIS — E1149 Type 2 diabetes mellitus with other diabetic neurological complication: Secondary | ICD-10-CM

## 2012-01-07 DIAGNOSIS — E1049 Type 1 diabetes mellitus with other diabetic neurological complication: Secondary | ICD-10-CM

## 2012-01-07 DIAGNOSIS — E1142 Type 2 diabetes mellitus with diabetic polyneuropathy: Secondary | ICD-10-CM

## 2012-01-07 MED ORDER — GLUCOSE BLOOD VI STRP: 1.0000 | ORAL_STRIP | Freq: Four times a day (QID) | Status: DC

## 2012-01-07 MED ORDER — "INSULIN SYRINGE-NEEDLE U-100 31G X 5/16"" 0.5 ML MISC": 1.0000 | Freq: Four times a day (QID) | Status: DC

## 2012-01-07 NOTE — Patient Instructions (Addendum)
good diet and exercise habits significanly improve the control of your diabetes.  please let me know if you wish to be referred to a dietician.  high blood sugar is very risky to your health.  you should see an eye doctor every year. controlling your blood pressure and cholesterol drastically reduces the damage diabetes does to your body.  this also applies to quitting smoking.  please discuss these with your doctor.   check your blood sugar 4 times a day--before the 3 meals, and at bedtime.  also check if you have symptoms of your blood sugar being too high or too low.  please keep a record of the readings and bring it to your next appointment here.  please call us sooner if your blood sugar goes below 70, or if it stays over 200. In view of your medical condition, you should avoid pregnancy until we have decided it is safe Please also make an appointment with a new regular doctor.   blood tests are being requested for you today.  You will receive a letter with results.   For now, reduce the lantus to 18 units daily, and:  Take humalog 3x a day (just before each meal) 3-4-4 units.  You can vary this by 1 unit either way, depending on the size of the meal. Please come back for a follow-up appointment in 3 months.

## 2012-01-07 NOTE — Progress Notes (Signed)
Subjective:    Patient ID: Chelsea Sparks, female    DOB: 1990-12-07, 21 y.o.   MRN: [id number]  HPI pt states 11 years h/o dm.  it is complicated by gastroparesis.  he has been on insulin since dx.  pt says her diet is limited by gastroparesis, and exercise is not good.  She does not want pump rx. Pt states few years of moderate pain at the legs, and assoc numbness.   She take lantus 21 units qd, and prn humalog (averages 6-8 units total per day).  she brings a record of her cbg's which i have reviewed today.  It varies from 60-200, but most are approx 100.  It is in general higher as the day goes on, but not necessarily so.   Past Medical History  Diagnosis Date  . Diabetes mellitus     Type 1  . Hypertension age 27  . Panic attacks   . Anxiety     Past Surgical History  Procedure Date  . No past surgeries   . Cystoscopy     History   Social History  . Marital Status: Single    Spouse Name: N/A    Number of Children: 0  . Years of Education: N/A   Occupational History  .     Social History Main Topics  . Smoking status: Never Smoker   . Smokeless tobacco: Never Used  . Alcohol Use: No  . Drug Use: No  . Sexually Active: No   Other Topics Concern  . Not on file   Social History Narrative  . No narrative on file    Current Outpatient Prescriptions on File Prior to Visit  Medication Sig Dispense Refill  . ibuprofen (ADVIL,MOTRIN) 800 MG tablet Take 1 tablet (800 mg total) by mouth every 8 (eight) hours as needed for pain.  30 tablet  0  . insulin glargine (LANTUS) 100 UNIT/ML injection Inject 21 Units into the skin at bedtime.       . insulin lispro (HUMALOG) 100 UNIT/ML injection Inject 0-10 Units into the skin 3 (three) times daily before meals. Sliding scale insulin; takes 1 unit of insulin per 40 score over 150 base CBG      . polyethylene glycol (MIRALAX / GLYCOLAX) packet Take 17 g by mouth daily.        No Known Allergies  Family History  Problem  Relation Age of Onset  . Diabetes Father   . Diabetes Maternal Grandmother   . Breast cancer Maternal Aunt   . Heart attack Maternal Grandfather     BP 102/70  Pulse 116  Temp(Src) 98.2 F (36.8 C) (Oral)  Ht 5\' 5"  (1.651 m)  Wt 109 lb (49.442 kg)  BMI 18.14 kg/m2  SpO2 99%  LMP 01/03/2012   Review of Systems denies blurry vision, headache, chest pain, sob, n/v, urinary frequency, cramps, excessive diaphoresis, difficulty with concentration, and rhinorrhea.  She has lost 10 lbs x 2 mos.  She attributes depression to her health problems.  She has regular menses.  She has easy bruising.   Constipation is improved.      Objective:   Physical Exam VS: see vs page GEN: no distress.  Lean body habitus. HEAD: head: no deformity eyes: no periorbital swelling, no proptosis external nose and ears are normal mouth: no lesion seen NECK: supple, thyroid is not enlarged CHEST WALL: no deformity LUNGS:  Clear to auscultation CV: reg rate and rhythm, no murmur ABD: abdomen is soft,  nontender.  no hepatosplenomegaly.  not distended.  no hernia MUSCULOSKELETAL: muscle bulk and strength are grossly normal.  no obvious joint swelling.  gait is normal and steady EXTEMITIES: no deformity.  no ulcer on the feet.  feet are of normal color and temp.  no edema PULSES: dorsalis pedis intact bilat.  no carotid bruit NEURO:  cn 2-12 grossly intact.   readily moves all 4's.  sensation is intact to touch on the feet SKIN:  Normal texture and temperature.  No rash or suspicious lesion is visible.   NODES:  None palpable at the neck.   PSYCH: alert, oriented x3.  Does not appear anxious nor depressed.  Lab Results  Component Value Date   HGBA1C 6.9* 01/07/2012      Assessment & Plan:  Type 1 DM.  Based on the pattern of her cbg's, she needs slight adjustment in her therapy Leg pain, prob neuropathic Gastroparesis.  This complicates the rx of DM. Depression.  This complicates the rx of her DM. Weight  loss, uncertain etiology.

## 2012-01-08 ENCOUNTER — Telehealth: Payer: Self-pay | Admitting: *Deleted

## 2012-01-08 NOTE — Telephone Encounter (Signed)
Called pt to inform of A1c results, pt informed (letter also mailed to pt). 

## 2012-01-12 NOTE — Progress Notes (Signed)
This encounter was created in error - please disregard.

## 2012-01-13 ENCOUNTER — Ambulatory Visit: Payer: 59 | Admitting: Gastroenterology

## 2012-01-13 ENCOUNTER — Encounter: Payer: Self-pay | Admitting: Obstetrics and Gynecology

## 2012-03-03 ENCOUNTER — Ambulatory Visit (INDEPENDENT_AMBULATORY_CARE_PROVIDER_SITE_OTHER): Payer: 59 | Admitting: Internal Medicine

## 2012-03-03 ENCOUNTER — Other Ambulatory Visit (INDEPENDENT_AMBULATORY_CARE_PROVIDER_SITE_OTHER): Payer: 59

## 2012-03-03 ENCOUNTER — Encounter: Payer: Self-pay | Admitting: Internal Medicine

## 2012-03-03 VITALS — BP 100/62 | HR 90 | Temp 98.5°F | Resp 16 | Wt 101.0 lb

## 2012-03-03 DIAGNOSIS — G589 Mononeuropathy, unspecified: Secondary | ICD-10-CM

## 2012-03-03 DIAGNOSIS — G629 Polyneuropathy, unspecified: Secondary | ICD-10-CM | POA: Insufficient documentation

## 2012-03-03 DIAGNOSIS — E1142 Type 2 diabetes mellitus with diabetic polyneuropathy: Secondary | ICD-10-CM

## 2012-03-03 DIAGNOSIS — E1149 Type 2 diabetes mellitus with other diabetic neurological complication: Secondary | ICD-10-CM

## 2012-03-03 DIAGNOSIS — E1049 Type 1 diabetes mellitus with other diabetic neurological complication: Secondary | ICD-10-CM

## 2012-03-03 DIAGNOSIS — Z23 Encounter for immunization: Secondary | ICD-10-CM

## 2012-03-03 LAB — URINALYSIS, ROUTINE W REFLEX MICROSCOPIC
Ketones, ur: NEGATIVE
Nitrite: NEGATIVE
Specific Gravity, Urine: <=1.005 (ref 1.000–1.030)
Urobilinogen, UA: 0.2 (ref 0.0–1.0)

## 2012-03-03 LAB — COMPREHENSIVE METABOLIC PANEL
BUN: 8 mg/dL (ref 6–23)
CO2: 27 mEq/L (ref 19–32)
Calcium: 9.8 mg/dL (ref 8.4–10.5)
Chloride: 104 mEq/L (ref 96–112)
Creatinine, Ser: 0.4 mg/dL (ref 0.4–1.2)
GFR: 238.94 mL/min (ref >60.00)
Glucose, Bld: 111 mg/dL — ABNORMAL HIGH (ref 70–99)
Total Bilirubin: 1.1 mg/dL (ref 0.3–1.2)

## 2012-03-03 LAB — CBC WITH DIFFERENTIAL/PLATELET
Basophils Relative: 0.3 % (ref 0.0–3.0)
HCT: 35.4 % — ABNORMAL LOW (ref 36.0–46.0)
Hemoglobin: 11.8 g/dL — ABNORMAL LOW (ref 12.0–15.0)
Lymphocytes Relative: 45.4 % (ref 12.0–46.0)
Lymphs Abs: 2.3 10*3/uL (ref 0.7–4.0)
Monocytes Relative: 8.2 % (ref 3.0–12.0)
Neutro Abs: 2.3 10*3/uL (ref 1.4–7.7)
RBC: 4.15 Mil/uL (ref 3.87–5.11)

## 2012-03-03 LAB — TSH: TSH: 1.45 u[IU]/mL (ref 0.35–5.50)

## 2012-03-03 LAB — VITAMIN B12: Vitamin B-12: 1014 pg/mL — ABNORMAL HIGH (ref 211–911)

## 2012-03-03 MED ORDER — AMITRIPTYLINE HCL 10 MG PO TABS: 10.0000 mg | ORAL_TABLET | Freq: Every day | ORAL | Status: DC

## 2012-03-03 NOTE — Progress Notes (Signed)
Subjective:    Patient ID: Chelsea Sparks, female    DOB: March 30, 1991, 21 y.o.   MRN: [id number]  Diabetes She presents for her follow-up diabetic visit. She has type 1 diabetes mellitus. Onset time: 11. Her disease course has been worsening. There are no hypoglycemic associated symptoms. Pertinent negatives for hypoglycemia include no confusion, dizziness, headaches, nervousness/anxiousness, pallor, seizures, speech difficulty or tremors. Associated symptoms include foot paresthesias (burning and tingling in both feet). Pertinent negatives for diabetes include no blurred vision, no chest pain, no fatigue, no foot ulcerations, no polydipsia, no polyphagia, no polyuria, no visual change, no weakness and no weight loss. There are no hypoglycemic complications. Symptoms are worsening. Symptoms have been present for 2 months. Diabetic complications include autonomic neuropathy (gastroparesis) and peripheral neuropathy. Current diabetic treatment includes intensive insulin program. She is compliant with treatment all of the time. Her weight is decreasing steadily. She is following a generally healthy diet. Meal planning includes avoidance of concentrated sweets. She never participates in exercise. There is no change in her home blood glucose trend. Her breakfast blood glucose range is generally <70 mg/dl. Her lunch blood glucose range is generally 90-110 mg/dl. Her dinner blood glucose range is generally 110-130 mg/dl. Her highest blood glucose is 110-130 mg/dl. Her overall blood glucose range is 90-110 mg/dl. An ACE inhibitor/angiotensin II receptor blocker is contraindicated. She does not see a podiatrist.Eye exam is not current.      Review of Systems  Constitutional: Positive for unexpected weight change (some weight loss). Negative for fever, chills, weight loss, diaphoresis, activity change, appetite change and fatigue.  HENT: Negative.   Eyes: Negative.  Negative for blurred vision.  Respiratory:  Negative for cough, chest tightness, shortness of breath, wheezing and stridor.   Cardiovascular: Negative for chest pain, palpitations and leg swelling.  Gastrointestinal: Negative for nausea, vomiting, abdominal pain, diarrhea, constipation, blood in stool and abdominal distention.  Genitourinary: Negative.  Negative for polyuria.  Musculoskeletal: Negative for myalgias, back pain, joint swelling, arthralgias and gait problem.  Skin: Negative for color change, pallor, rash and wound.  Neurological: Negative for dizziness, tremors, seizures, syncope, facial asymmetry, speech difficulty, weakness, light-headedness, numbness and headaches.  Hematological: Negative for polydipsia, polyphagia and adenopathy. Does not bruise/bleed easily.  Psychiatric/Behavioral: Positive for disturbed wake/sleep cycle (some DFA) and dysphoric mood (some sadness). Negative for hallucinations, behavioral problems, confusion, self-injury, decreased concentration and agitation. The patient is not nervous/anxious and is not hyperactive.        Objective:   Physical Exam  Vitals reviewed. Constitutional: She is oriented to person, place, and time. She appears well-developed and well-nourished. No distress.  HENT:  Head: Normocephalic and atraumatic.  Mouth/Throat: Oropharynx is clear and moist. No oropharyngeal exudate.  Eyes: Conjunctivae are normal. Right eye exhibits no discharge. Left eye exhibits no discharge. No scleral icterus.  Neck: Normal range of motion. Neck supple. No JVD present. No tracheal deviation present. No thyromegaly present.  Cardiovascular: Normal rate, regular rhythm, normal heart sounds and intact distal pulses.  Exam reveals no gallop and no friction rub.   No murmur heard. Pulmonary/Chest: Effort normal and breath sounds normal. No stridor. No respiratory distress. She has no wheezes. She has no rales. She exhibits no tenderness.  Abdominal: Soft. Bowel sounds are normal. She exhibits no  distension and no mass. There is no tenderness. There is no rebound and no guarding.  Musculoskeletal: Normal range of motion. She exhibits no edema and no tenderness.  Right foot: She exhibits normal range of motion, no bony tenderness, no swelling, normal capillary refill, no crepitus and no deformity.       Left foot: She exhibits normal range of motion, no bony tenderness, no swelling, normal capillary refill, no crepitus and no deformity.  Lymphadenopathy:    She has no cervical adenopathy.  Neurological: She is alert and oriented to person, place, and time. She has normal strength. She displays no atrophy and no tremor. No cranial nerve deficit or sensory deficit. She exhibits normal muscle tone. She displays a negative Romberg sign. She displays no seizure activity. Coordination and gait normal. She displays no Babinski's sign on the right side. She displays no Babinski's sign on the left side.  Reflex Scores:      Tricep reflexes are 1+ on the right side and 1+ on the left side.      Bicep reflexes are 1+ on the right side and 1+ on the left side.      Brachioradialis reflexes are 1+ on the right side and 1+ on the left side.      Patellar reflexes are 1+ on the right side and 1+ on the left side.      Achilles reflexes are 0 on the right side and 0 on the left side. Skin: Skin is warm and dry. No rash noted. She is not diaphoretic. No erythema. No pallor.  Psychiatric: She has a normal mood and affect. Her behavior is normal. Judgment and thought content normal.     Lab Results  Component Value Date   WBC 5.8 12/31/2011   HGB 12.7 12/31/2011   HCT 38.6 12/31/2011   PLT 294.0 12/31/2011   GLUCOSE 211* 12/20/2011   ALT 8 11/30/2011   AST 11 11/30/2011   NA 133* 12/20/2011   K 3.8 12/20/2011   CL 98 12/20/2011   CREATININE 0.41* 12/20/2011   BUN 8 12/20/2011   CO2 24 12/20/2011   HGBA1C 6.9* 01/07/2012       Assessment & Plan:

## 2012-03-03 NOTE — Patient Instructions (Signed)
Diabetic Neuropathy Diabetic neuropathy is a common complication caused by diabetes. Neuropathy is a term that means nerve disease or damage. If your diabetes is uncontrolled and you have high blood glucose (sugar) levels, over time, this can lead to damage to nerves throughout your body. There are three types of diabetic neuropathy:   Peripheral.   Autonomic.   Focal.  PERIPHERAL NEUROPATHY Peripheral neuropathy is the most common form of diabetic neuropathy. It causes damage to the nerves of the feet and legs and eventually the hands and arms.  SYMPTOMS  Peripheral neuropathy occurs slowly over time. The peripheral nerves sense touch, hot and cold, and pain. When these nerves no longer work:   Your feet become numb.   You can no longer feel pressure or pain in your feet.   You may have burning, stabbing or aching pain.  This can lead to:  Thick calluses over pressure areas.   Pressure sores.   Ulcers. Ulcers can become infected with germs (bacteria) and can even lead to infection in the bones of the feet.  DIAGNOSIS  The diagnosis of diabetic neuropathy is difficult at best. Sensory function testing can be done with:  Light touch using a monofilament.   Vibration with tuning fork.   Sharp sensation with pin prick  Other tests that can help diagnose neuropathy are:  Nerve Conduction Velocities (NCV). This checks the transmission of electrical current through a nerve.   Electromyography (EMG). This shows how muscles respond to electrical signals transmitted by nearby nerves.   Quantitative sensory testing, which is used to assess how your nerves respond to vibration and changes in temperature.  AUTONOMIC NEUROPATHY The autonomic nervous system controls functions that you do not think about. Examples would be:   Heart beat.   Regulation of body temperature.   Blood pressure.   Urination.   Digestion.   Sweating.   Sexual function.  SYMPTOMS  The symptoms of  autonomic neuropathy vary depending on which nerves are affected.   There can be problems with digestion such as:   Feeling sick to your stomach (nausea).   Vomiting.   Bloating.   Constipation.   Diarrhea.   Abdominal pain.   Difficulty with urination may occur because of the inability to sense when your bladder is full. You may have urine leakage (incontinence) or inability to empty your bladder completely (retention).   Palpitations or a feeling of an abnormal heart beat.   Blood pressure drops on arising (orthostatic hypotension). This can happen when you first sit up or stand up. It causes you to feel:   Dizzy.   Weak.   Faint.   Sexual functioning:   In men, inability to attain and maintain an erection.   In women, vaginal dryness and problems with decreased sexual desire and arousal.  DIAGNOSIS  Diagnosis is often based on reported symptoms. Tell your medical caregiver if you experience:   Dizziness.   Constipation.   Diarrhea.   Inappropriate urination or inability to urinate.   Inability to get or maintain an erection.  Tests that may be done include:  An EKG or Holter Monitor. These are tests that can help show problems with the heart rate or heart rhythm.   X-rays can be used to find if there are problems with your ability to properly empty food from your stomach into the small intestine after eating.  FOCAL NEUROPATHY Focal neuropathy affects just one nerve tract and occurs suddenly. However, it usually improves by itself   over time. It does not cause long term damage, and treatments are usually needed only until the problem improves. SYMPTOMS  Examples include:   Abnormal eye movements or abnormal alignment of both eyes.   Weakness in the wrist.   Foot drop, which results in inability to lift the foot properly. This causes abnormal walking or foot movement.  DIAGNOSIS  Diagnosis is made based on your symptoms and what your caregiver finds on  your exam. Other tests that may be done include:  Nerve Conduction Velocities (NCV). This checks the transmission of electrical current through a nerve.   Electromyography (EMG). This shows how muscles respond to electrical signals transmitted by nearby nerves.   Quantitative sensory testing, which is used to assess how your nerves respond to vibration and changes in temperature.  TREATMENT Once nerve damage occurs it cannot be reversed. The goal of treatment is to keep the disease from getting worse. If it gets worse, it will affect more nerve fibers. Controlling your blood (sugar) is the key. You will need to keep your blood glucose and A1c at the target range prescribed by your caregiver. Things that will help control blood glucose levels include:  Blood glucose monitoring.   Meal planning.   Physical activity.   Diabetes medication.  Over time, maintaining lower blood glucose levels helps lessen symptoms. Sometimes, prescription pain medicine is needed. Focal neuropathy can be painful and unpredictable and occurs most often in older adults with diabetes.  SEEK MEDICAL CARE IF:   You develop peripheral nerve symptoms such as burning, numbness, or pain in your feet, legs or hands.   You develop autonomic nerve symptoms such as:   Dizziness.   Abnormal urinary control.   Inability to get an erection.   You develop focal nerve symptoms such as sudden abnormal eye movements or sudden foot drop.  Document Released: 11/02/2001 Document Revised: 08/13/2011 Document Reviewed: 02/01/2009 [employer] Patient Information 2012 [employer]. 

## 2012-03-03 NOTE — Assessment & Plan Note (Signed)
Her last a1c looks good and she reports good BS control since then, I will check her FBS, renal function and lytes today

## 2012-03-03 NOTE — Assessment & Plan Note (Signed)
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

## 2012-03-17 ENCOUNTER — Encounter: Payer: Self-pay | Admitting: Internal Medicine

## 2012-03-17 ENCOUNTER — Ambulatory Visit (INDEPENDENT_AMBULATORY_CARE_PROVIDER_SITE_OTHER): Payer: 59 | Admitting: Internal Medicine

## 2012-03-17 VITALS — BP 98/60 | HR 92 | Temp 98.2°F | Resp 16 | Ht 65.0 in | Wt 103.0 lb

## 2012-03-17 DIAGNOSIS — L723 Sebaceous cyst: Secondary | ICD-10-CM

## 2012-03-17 DIAGNOSIS — L089 Local infection of the skin and subcutaneous tissue, unspecified: Secondary | ICD-10-CM | POA: Insufficient documentation

## 2012-03-17 MED ORDER — CEFUROXIME AXETIL 500 MG PO TABS: 500.0000 mg | ORAL_TABLET | Freq: Two times a day (BID) | ORAL | Status: AC

## 2012-03-17 NOTE — Progress Notes (Signed)
  Subjective:    Patient ID: Chelsea Sparks, female    DOB: 1991-06-14, 21 y.o.   MRN: [id number]  HPI  She returns and complains that she has a lump under her left collar bone for one week - there was some pain and swelling at the onset but hat has improved.  Review of Systems  Constitutional: Negative for fever, chills, diaphoresis, activity change, appetite change, fatigue and unexpected weight change.  HENT: Negative.   Eyes: Negative.   Respiratory: Negative.   Cardiovascular: Negative.   Gastrointestinal: Negative.   Genitourinary: Negative.   Musculoskeletal: Negative.   Skin: Negative for color change, pallor, rash and wound.  Neurological: Negative.   Hematological: Negative for adenopathy. Does not bruise/bleed easily.  Psychiatric/Behavioral: Negative.        Objective:   Physical Exam  Vitals reviewed. Constitutional: She is oriented to person, place, and time. She appears well-developed and well-nourished. No distress.  HENT:  Head: Normocephalic and atraumatic.  Mouth/Throat: Oropharynx is clear and moist. No oropharyngeal exudate.  Eyes: Conjunctivae are normal. Right eye exhibits no discharge. Left eye exhibits no discharge. No scleral icterus.  Neck: Normal range of motion. Neck supple. No JVD present. No tracheal deviation present. No thyromegaly present.  Cardiovascular: Normal rate, regular rhythm, normal heart sounds and intact distal pulses.  Exam reveals no gallop and no friction rub.   No murmur heard. Pulmonary/Chest: Effort normal and breath sounds normal. No accessory muscle usage or stridor. Not tachypneic. No respiratory distress. She has no wheezes. She has no rales. Chest wall is not dull to percussion. She exhibits mass. She exhibits no tenderness, no bony tenderness, no laceration, no crepitus, no edema, no deformity, no swelling and no retraction.    Abdominal: Soft. Bowel sounds are normal. She exhibits no distension and no mass. There is no  tenderness. There is no rebound and no guarding.  Musculoskeletal: Normal range of motion. She exhibits no edema and no tenderness.  Lymphadenopathy:    She has no cervical adenopathy.  Neurological: She is oriented to person, place, and time.  Skin: Skin is warm and dry. No rash noted. She is not diaphoretic. No erythema. No pallor.  Psychiatric: She has a normal mood and affect. Her behavior is normal. Judgment and thought content normal.          Assessment & Plan:

## 2012-03-17 NOTE — Assessment & Plan Note (Signed)
This feels like a sebaceous cyst that is infected so I have asked her to start ceftin for the infection, I will recheck the area in 2-3 weeks and if it has not resolved then may consider an imaging study or referral for biopsy

## 2012-03-17 NOTE — Patient Instructions (Signed)

## 2012-03-26 ENCOUNTER — Encounter (HOSPITAL_COMMUNITY): Payer: Self-pay | Admitting: Emergency Medicine

## 2012-03-26 ENCOUNTER — Emergency Department (HOSPITAL_COMMUNITY)
Admission: EM | Admit: 2012-03-26 | Discharge: 2012-03-26 | Disposition: A | Discharge status: Home / Self Care | Payer: 59 | Attending: Emergency Medicine | Admitting: Emergency Medicine

## 2012-03-26 DIAGNOSIS — I10 Essential (primary) hypertension: Secondary | ICD-10-CM | POA: Insufficient documentation

## 2012-03-26 DIAGNOSIS — R112 Nausea with vomiting, unspecified: Secondary | ICD-10-CM | POA: Insufficient documentation

## 2012-03-26 DIAGNOSIS — R197 Diarrhea, unspecified: Secondary | ICD-10-CM | POA: Insufficient documentation

## 2012-03-26 DIAGNOSIS — E109 Type 1 diabetes mellitus without complications: Secondary | ICD-10-CM | POA: Insufficient documentation

## 2012-03-26 DIAGNOSIS — Z794 Long term (current) use of insulin: Secondary | ICD-10-CM | POA: Insufficient documentation

## 2012-03-26 LAB — CBC WITH DIFFERENTIAL/PLATELET
Basophils Absolute: 0 K/uL (ref 0.0–0.1)
Basophils Relative: 0 % (ref 0–1)
Eosinophils Absolute: 0.1 K/uL (ref 0.0–0.7)
Eosinophils Relative: 1 % (ref 0–5)
HCT: 38.6 % (ref 36.0–46.0)
Hemoglobin: 13.1 g/dL (ref 12.0–15.0)
Lymphocytes Relative: 7 % — ABNORMAL LOW (ref 12–46)
Lymphs Abs: 0.9 K/uL (ref 0.7–4.0)
MCH: 28.4 pg (ref 26.0–34.0)
MCHC: 33.9 g/dL (ref 30.0–36.0)
MCV: 83.5 fL (ref 78.0–100.0)
Monocytes Absolute: 0.8 K/uL (ref 0.1–1.0)
Monocytes Relative: 6 % (ref 3–12)
Neutro Abs: 11.7 K/uL — ABNORMAL HIGH (ref 1.7–7.7)
Neutrophils Relative %: 87 % — ABNORMAL HIGH (ref 43–77)
Platelets: 293 K/uL (ref 150–400)
RBC: 4.62 MIL/uL (ref 3.87–5.11)
RDW: 12.7 % (ref 11.5–15.5)
WBC: 13.5 K/uL — ABNORMAL HIGH (ref 4.0–10.5)

## 2012-03-26 LAB — COMPREHENSIVE METABOLIC PANEL
BUN: 16 mg/dL (ref 6–23)
CO2: 23 mEq/L (ref 19–32)
Chloride: 101 mEq/L (ref 96–112)
Creatinine, Ser: 0.58 mg/dL (ref 0.50–1.10)
GFR calc non Af Amer: >90 mL/min (ref >90)
Glucose, Bld: 182 mg/dL — ABNORMAL HIGH (ref 70–99)
Total Bilirubin: 1 mg/dL (ref 0.3–1.2)

## 2012-03-26 LAB — GLUCOSE, CAPILLARY: Glucose-Capillary: 149 mg/dL — ABNORMAL HIGH (ref 70–99)

## 2012-03-26 LAB — LIPASE, BLOOD: Lipase: 11 U/L (ref 11–59)

## 2012-03-26 MED ORDER — SODIUM CHLORIDE 0.9 % IV BOLUS (SEPSIS): 1000.0000 mL | Freq: Once | INTRAVENOUS | Status: DC

## 2012-03-26 MED ORDER — ONDANSETRON 8 MG PO TBDP: 8.0000 mg | ORAL_TABLET | Freq: Three times a day (TID) | ORAL | Status: AC | PRN

## 2012-03-26 MED ORDER — ONDANSETRON HCL 4 MG/2ML IJ SOLN: 4.0000 mg | Freq: Once | INTRAMUSCULAR | Status: DC

## 2012-03-26 MED ORDER — ONDANSETRON 8 MG PO TBDP
8.0000 mg | ORAL_TABLET | Freq: Once | ORAL | Status: AC
  Administered 2012-03-26: 8 mg via ORAL
  Filled 2012-03-26: qty 1

## 2012-03-26 NOTE — ED Provider Notes (Signed)
History     CSN: [id number]  Arrival date & time 03/26/12  4098   First MD Initiated Contact with Patient 03/26/12 1119      Chief Complaint  Patient presents with  . Emesis    x 3, diarrhea x 1    (Consider location/radiation/quality/duration/timing/severity/associated sxs/prior treatment) Patient is a 21 y.o. female presenting with vomiting. The history is provided by the patient.  Emesis  This is a new problem. Associated symptoms include abdominal pain, diarrhea and a fever. Pertinent negatives include no headaches.   patient's had nausea vomiting diarrhea since 4 in the morning. Some crampy abdominal pain. She states she's felt feverish. Some family members have had similar symptoms patient states she feels a little weak all over. She states her sugars have been in the low 100s.  Past Medical History  Diagnosis Date  . Diabetes mellitus     Type 1  . Hypertension age 53  . Panic attacks   . Anxiety     Past Surgical History  Procedure Date  . No past surgeries   . Cystoscopy     Family History  Problem Relation Age of Onset  . Diabetes Father   . Diabetes Maternal Grandmother   . Breast cancer Maternal Aunt   . Heart attack Maternal Grandfather     History  Substance Use Topics  . Smoking status: Never Smoker   . Smokeless tobacco: Never Used  . Alcohol Use: No    OB History    Grav Para Term Preterm Abortions TAB SAB Ect Mult Living   0               Review of Systems  Constitutional: Positive for fever and fatigue. Negative for activity change and appetite change.  HENT: Negative for neck stiffness.   Eyes: Negative for pain.  Respiratory: Negative for chest tightness and shortness of breath.   Cardiovascular: Negative for chest pain and leg swelling.  Gastrointestinal: Positive for nausea, vomiting, abdominal pain and diarrhea.  Genitourinary: Negative for flank pain.  Musculoskeletal: Negative for back pain.  Skin: Negative for rash.    Neurological: Negative for weakness, numbness and headaches.  Psychiatric/Behavioral: Negative for behavioral problems.    Allergies  Review of patient's allergies indicates no known allergies.  Home Medications   Current Outpatient Rx  Name Route Sig Dispense Refill  . AMITRIPTYLINE HCL 10 MG PO TABS Oral Take 1 tablet (10 mg total) by mouth at bedtime. 90 tablet 1  . CIPROFLOXACIN HCL 500 MG PO TABS Oral Take 500 mg by mouth 2 (two) times daily.    . IBUPROFEN 200 MG PO TABS Oral Take 200 mg by mouth every 6 (six) hours as needed. PAIN    . INSULIN GLARGINE 100 UNIT/ML [place] SOLN Subcutaneous Inject 13 Units into the skin at bedtime.     . INSULIN LISPRO (HUMAN) 100 UNIT/ML [place] SOLN Subcutaneous Inject 2-6 Units into the skin 3 (three) times daily before meals. SLIDING SCALE    . LOPERAMIDE HCL 2 MG PO CAPS Oral Take 2 mg by mouth as needed. DIARRHEA    . ONDANSETRON 8 MG PO TBDP Oral Take 1 tablet (8 mg total) by mouth every 8 (eight) hours as needed for nausea. 20 tablet 0    BP 113/71  Pulse 125  Temp 98.3 F (36.8 C) (Other (Comment))  Resp 18  SpO2 100%  LMP 03/24/2012  Physical Exam  Nursing note and vitals reviewed. Constitutional: She is oriented to  person, place, and time. She appears well-developed and well-nourished.  HENT:  Head: Normocephalic and atraumatic.  Eyes: EOM are normal. Pupils are equal, round, and reactive to light.  Neck: Normal range of motion. Neck supple.  Cardiovascular: Regular rhythm and normal heart sounds.   No murmur heard.      tachycardia  Pulmonary/Chest: Effort normal and breath sounds normal. No respiratory distress. She has no wheezes. She has no rales.  Abdominal: Soft. Bowel sounds are normal. She exhibits no distension. There is no tenderness. There is no rebound and no guarding.  Musculoskeletal: Normal range of motion.  Neurological: She is alert and oriented to person, place, and time. No cranial nerve deficit.  Skin: Skin is  warm and dry.  Psychiatric: She has a normal mood and affect. Her speech is normal.    ED Course  Procedures (including critical care time)  Labs Reviewed  CBC WITH DIFFERENTIAL - Abnormal; Notable for the following:    WBC 13.5 (*)     Neutrophils Relative 87 (*)     Neutro Abs 11.7 (*)     Lymphocytes Relative 7 (*)     All other components within normal limits  COMPREHENSIVE METABOLIC PANEL - Abnormal; Notable for the following:    Potassium 5.2 (*)     Glucose, Bld 182 (*)     All other components within normal limits  GLUCOSE, CAPILLARY - Abnormal; Notable for the following:    Glucose-Capillary 149 (*)     All other components within normal limits  LIPASE, BLOOD  URINALYSIS, ROUTINE W REFLEX MICROSCOPIC   No results found.   1. Nausea vomiting and diarrhea       MDM  Patient with nausea vomiting diarrhea. Mild abdominal pain. Initially tachycardic. Nurses were unable to get an IV access. Patient was given oral Zofran. She then requested to leave and go home. Patient was discharged. Potassium was minimally elevated on lab work. This is a real finding, however the patient left before he was able to be repeated.        Juliet Rude. Rubin Payor, MD 03/26/12 1304

## 2012-03-26 NOTE — ED Notes (Signed)
IV attempt x2, Candise Bowens attempted x2, Patty charge nurse at bedside.

## 2012-03-26 NOTE — ED Notes (Signed)
Woke this morning w/ abdominal pain, emesis x 3, diarrhea x 1. Mom did Rx w/ single Imodium. Pt states feels very feverish, and is DM for 11 years.

## 2012-03-30 ENCOUNTER — Encounter: Payer: Self-pay | Admitting: Internal Medicine

## 2012-04-13 ENCOUNTER — Ambulatory Visit (INDEPENDENT_AMBULATORY_CARE_PROVIDER_SITE_OTHER): Payer: 59 | Admitting: Endocrinology

## 2012-04-13 ENCOUNTER — Other Ambulatory Visit (INDEPENDENT_AMBULATORY_CARE_PROVIDER_SITE_OTHER): Payer: 59

## 2012-04-13 ENCOUNTER — Encounter: Payer: Self-pay | Admitting: Endocrinology

## 2012-04-13 VITALS — BP 100/62 | HR 121 | Temp 97.1°F | Ht 65.0 in | Wt 103.0 lb

## 2012-04-13 DIAGNOSIS — E1149 Type 2 diabetes mellitus with other diabetic neurological complication: Secondary | ICD-10-CM

## 2012-04-13 DIAGNOSIS — E1142 Type 2 diabetes mellitus with diabetic polyneuropathy: Secondary | ICD-10-CM

## 2012-04-13 DIAGNOSIS — E1049 Type 1 diabetes mellitus with other diabetic neurological complication: Secondary | ICD-10-CM

## 2012-04-13 NOTE — Progress Notes (Signed)
  Subjective:    Patient ID: Chelsea Sparks, female    DOB: 23-Oct-1990, 21 y.o.   MRN: [id number]  HPI pt returns for f/u of type 1 DM (dx'ed 2002; complicated by gastroparesis.  She does not want pump rx).  she brings a record of her cbg's which i have reviewed today.  It varies from 50-200, but most are approx 100.  It is in general higher as the day goes on, but not necessarily so.   She take only 13 units of lantus, qhs.  Past Medical History  Diagnosis Date  . Diabetes mellitus     Type 1  . Hypertension age 37  . Panic attacks   . Anxiety     Past Surgical History  Procedure Date  . No past surgeries   . Cystoscopy     History   Social History  . Marital Status: Single    Spouse Name: N/A    Number of Children: 0  . Years of Education: N/A   Occupational History  .     Social History Main Topics  . Smoking status: Never Smoker   . Smokeless tobacco: Never Used  . Alcohol Use: No  . Drug Use: No  . Sexually Active: No   Other Topics Concern  . Not on file   Social History Narrative  . No narrative on file    Current Outpatient Prescriptions on File Prior to Visit  Medication Sig Dispense Refill  . amitriptyline (ELAVIL) 10 MG tablet Take 1 tablet (10 mg total) by mouth at bedtime.  90 tablet  1  . ibuprofen (ADVIL,MOTRIN) 200 MG tablet Take 200 mg by mouth every 6 (six) hours as needed. PAIN      . insulin glargine (LANTUS) 100 UNIT/ML injection Inject 12 Units into the skin at bedtime.       . insulin lispro (HUMALOG) 100 UNIT/ML injection 3 (three) times daily before meals. 3x a day (just before each meal) 2-4-5 units, and syringes 4/day      . loperamide (IMODIUM) 2 MG capsule Take 2 mg by mouth as needed. DIARRHEA        No Known Allergies  Family History  Problem Relation Age of Onset  . Diabetes Father   . Diabetes Maternal Grandmother   . Breast cancer Maternal Aunt   . Heart attack Maternal Grandfather     BP 100/62  Pulse 121  Temp 97.1  F (36.2 C) (Oral)  Ht 5\' 5"  (1.651 m)  Wt 103 lb (46.72 kg)  BMI 17.14 kg/m2  SpO2 98%  LMP 03/24/2012  Review of Systems Denies LOC    Objective:   Physical Exam VITAL SIGNS:  See vs page GENERAL: no distress PSYCH: Alert and oriented x 3.  Does not appear anxious nor depressed. SKIN:  Insulin injection sites at the anterior abdomen are normal   Lab Results  Component Value Date   HGBA1C 6.4 04/13/2012      Assessment & Plan:  DM, well-controlled

## 2012-04-13 NOTE — Patient Instructions (Addendum)
check your blood sugar 4 times a day--before the 3 meals, and at bedtime.  also check if you have symptoms of your blood sugar being too high or too low.  please keep a record of the readings and bring it to your next appointment here.  please call us sooner if your blood sugar goes below 70, or if it stays over 200. For now, reduce the lantus to 12 units daily, and:  Take humalog 3x a day (just before each meal) 2-4-5 units.  You can vary this by 1 unit either way, depending on the size of the meal. Please come back for a follow-up appointment in 3 months.   blood tests are being requested for you today.  You will receive a letter with results. Please call if you decide to see a heart doctor for your fast heartbeat.

## 2012-04-18 ENCOUNTER — Other Ambulatory Visit: Payer: Self-pay | Admitting: Internal Medicine

## 2012-07-06 ENCOUNTER — Emergency Department (HOSPITAL_COMMUNITY)
Admission: EM | Admit: 2012-07-06 | Discharge: 2012-07-06 | Disposition: A | Discharge status: Home / Self Care | Payer: 59 | Attending: Emergency Medicine | Admitting: Emergency Medicine

## 2012-07-06 ENCOUNTER — Encounter (HOSPITAL_COMMUNITY): Payer: Self-pay

## 2012-07-06 DIAGNOSIS — R739 Hyperglycemia, unspecified: Secondary | ICD-10-CM

## 2012-07-06 DIAGNOSIS — Z79899 Other long term (current) drug therapy: Secondary | ICD-10-CM | POA: Insufficient documentation

## 2012-07-06 DIAGNOSIS — Z8659 Personal history of other mental and behavioral disorders: Secondary | ICD-10-CM | POA: Insufficient documentation

## 2012-07-06 DIAGNOSIS — I10 Essential (primary) hypertension: Secondary | ICD-10-CM | POA: Insufficient documentation

## 2012-07-06 DIAGNOSIS — R55 Syncope and collapse: Secondary | ICD-10-CM

## 2012-07-06 DIAGNOSIS — Z794 Long term (current) use of insulin: Secondary | ICD-10-CM | POA: Insufficient documentation

## 2012-07-06 DIAGNOSIS — E109 Type 1 diabetes mellitus without complications: Secondary | ICD-10-CM | POA: Insufficient documentation

## 2012-07-06 DIAGNOSIS — R000 Tachycardia, unspecified: Secondary | ICD-10-CM

## 2012-07-06 DIAGNOSIS — R4789 Other speech disturbances: Secondary | ICD-10-CM | POA: Insufficient documentation

## 2012-07-06 LAB — CBC WITH DIFFERENTIAL/PLATELET
Basophils Absolute: 0 10*3/uL (ref 0.0–0.1)
Eosinophils Relative: 0 % (ref 0–5)
HCT: 31.8 % — ABNORMAL LOW (ref 36.0–46.0)
Hemoglobin: 10.9 g/dL — ABNORMAL LOW (ref 12.0–15.0)
Lymphocytes Relative: 9 % — ABNORMAL LOW (ref 12–46)
MCHC: 34.3 g/dL (ref 30.0–36.0)
MCV: 82.4 fL (ref 78.0–100.0)
Monocytes Absolute: 0.7 10*3/uL (ref 0.1–1.0)
Monocytes Relative: 6 % (ref 3–12)
RDW: 12.7 % (ref 11.5–15.5)
WBC: 11.5 10*3/uL — ABNORMAL HIGH (ref 4.0–10.5)

## 2012-07-06 LAB — URINALYSIS, ROUTINE W REFLEX MICROSCOPIC
Glucose, UA: >1000 mg/dL — AB
Hgb urine dipstick: NEGATIVE
Protein, ur: NEGATIVE mg/dL

## 2012-07-06 LAB — BASIC METABOLIC PANEL
BUN: 17 mg/dL (ref 6–23)
CO2: 25 mEq/L (ref 19–32)
Calcium: 9.4 mg/dL (ref 8.4–10.5)
Creatinine, Ser: 0.5 mg/dL (ref 0.50–1.10)

## 2012-07-06 LAB — URINE MICROSCOPIC-ADD ON

## 2012-07-06 LAB — POCT PREGNANCY, URINE: Preg Test, Ur: NEGATIVE

## 2012-07-06 MED ORDER — SODIUM CHLORIDE 0.9 % IV BOLUS (SEPSIS)
1000.0000 mL | Freq: Once | INTRAVENOUS | Status: AC
  Administered 2012-07-06: 1000 mL via INTRAVENOUS

## 2012-07-06 NOTE — ED Notes (Addendum)
Pt sts she "blacked out" and fell out of bed last night.  Doesn't remember returning to bed.  Sts she does not know if she hit her head.  Denies headache or head injury.  Sts bruise on shoulder and hip.  Pt is A & O x 4.  Hx of diabetes. CBG 286.

## 2012-07-06 NOTE — ED Provider Notes (Signed)
Medical screening examination/treatment/procedure(s) were performed by non-physician practitioner and as supervising physician I was immediately available for consultation/collaboration.   Glynn Octave, MD 07/06/12 [phone]

## 2012-07-06 NOTE — ED Notes (Signed)
Patient is not able to give urine at this time, patient will like to have something to drink if that will help RN notified.

## 2012-07-06 NOTE — ED Provider Notes (Signed)
History     CSN: [id number]  Arrival date & time 07/06/12  1001   First MD Initiated Contact with Patient 07/06/12 1031      Chief Complaint  Patient presents with  . Loss of Consciousness    (Consider location/radiation/quality/duration/timing/severity/associated sxs/prior treatment) HPI Comments: This 21 year old female with a history of type 1 diabetes and tachycardia presents emergency department with a chief complaint of "black out".  Episode occurred last night. Patient states that she checked her blood sugar prior to going to bed with the finding of 122.  Patient then gave herself 13 units of Lantus as she normally does.  She states that she woke up on the floor extremely diaphoretic.  She reports similar episodes occurring in past when she was in DKA.  As well as being diaphoretic when she becomes hypoglycemic.  Patient reports her right shoulder and right hip are sore, but she denies any headache, change in vision, ataxia, disequilibrium, contusions, urinary symptoms including frequency or dysuria, fever, night sweats, chills, cough, hemoptysis, leg swelling, chest pain, shortness of breath.  Patient is a 21 y.o. female presenting with syncope. The history is provided by the patient and a parent.  Loss of Consciousness Associated symptoms include diaphoresis (at time of event, not current). Pertinent negatives include no abdominal pain, chest pain, chills, coughing, fever, headaches, myalgias, nausea, neck pain, numbness, rash, vomiting or weakness.    Past Medical History  Diagnosis Date  . Diabetes mellitus     Type 1  . Hypertension age 41  . Panic attacks   . Anxiety     Past Surgical History  Procedure Date  . No past surgeries   . Cystoscopy     Family History  Problem Relation Age of Onset  . Diabetes Father   . Diabetes Maternal Grandmother   . Breast cancer Maternal Aunt   . Heart attack Maternal Grandfather     History  Substance Use Topics  .  Smoking status: Never Smoker   . Smokeless tobacco: Never Used  . Alcohol Use: No    OB History    Grav Para Term Preterm Abortions TAB SAB Ect Mult Living   0               Review of Systems  Constitutional: Positive for diaphoresis (at time of event, not current). Negative for fever, chills and appetite change.  HENT: Negative for hearing loss, ear pain, trouble swallowing, neck pain, neck stiffness and tinnitus.   Eyes: Negative for photophobia, pain and visual disturbance.  Respiratory: Negative for cough, shortness of breath, wheezing and stridor.   Cardiovascular: Positive for syncope. Negative for chest pain and palpitations.  Gastrointestinal: Negative for nausea, vomiting and abdominal pain.  Genitourinary: Negative for dysuria, urgency and difficulty urinating.  Musculoskeletal: Negative for myalgias and back pain.  Skin: Negative for color change, pallor and rash.  Neurological: Positive for syncope. Negative for dizziness, seizures, facial asymmetry, speech difficulty, weakness, light-headedness, numbness and headaches.  Hematological: Does not bruise/bleed easily.  Psychiatric/Behavioral: Negative for behavioral problems, confusion, decreased concentration and agitation.  All other systems reviewed and are negative.    Allergies  Review of patient's allergies indicates no known allergies.  Home Medications   Current Outpatient Rx  Name Route Sig Dispense Refill  . ACETAMINOPHEN 325 MG PO TABS Oral Take 650 mg by mouth every 6 (six) hours as needed. FOR PAIN    . AMITRIPTYLINE HCL 10 MG PO TABS Oral Take 1 tablet (10  mg total) by mouth at bedtime. 90 tablet 1  . INSULIN GLARGINE 100 UNIT/ML [place] SOLN Subcutaneous Inject 13 Units into the skin at bedtime.     . INSULIN LISPRO (HUMAN) 100 UNIT/ML [place] SOLN  3 (three) times daily before meals. 3x a day (just before each meal) 2-4-5 units, and syringes 4/day 1 unit for every carbohydrate per patient specifics    . ONETOUCH  ULTRA BLUE VI STRP      . RELION INSULIN SYRINGE 31G X 5/16" 0.5 ML MISC        BP 95/74  Pulse 128  Temp 98.4 F (36.9 C) (Oral)  Ht 5\' 5"  (1.651 m)  Wt 110 lb (49.896 kg)  BMI 18.31 kg/m2  SpO2 100%  LMP 06/07/2012  Physical Exam  Nursing note and vitals reviewed. Constitutional: She is oriented to person, place, and time. She appears well-developed and well-nourished. No distress.       Patient not in visible distress  HENT:  Head: Normocephalic and atraumatic.  Eyes: Conjunctivae normal and EOM are normal. Pupils are equal, round, and reactive to light.  Neck: Normal range of motion. Neck supple. Normal carotid pulses, no hepatojugular reflux and no JVD present. Carotid bruit is not present.       No carotid bruits  Cardiovascular:       Tachycardic, intact distal pulses no pitting edema bilaterally.  Pulmonary/Chest: Effort normal and breath sounds normal. No respiratory distress.       Lungs clear to auscultation bilaterally  Abdominal: Soft. She exhibits no distension. There is no tenderness.       Nonpulsatile aorta  Musculoskeletal:       No bony ttp or right shoulder or right him. Normal ROM  Neurological: She is alert and oriented to person, place, and time. She displays a negative Romberg sign.       Cranial nerves III through XII intact, normal coordination, negative Romberg, strength 5/5 bilaterally. No ataxia  Skin: Skin is warm and dry. No pallor.  Psychiatric: She has a normal mood and affect. Her behavior is normal.    ED Course  Procedures (including critical care time)  Labs Reviewed  GLUCOSE, CAPILLARY - Abnormal; Notable for the following:    Glucose-Capillary 286 (*)     All other components within normal limits  URINALYSIS, ROUTINE W REFLEX MICROSCOPIC  CBC WITH DIFFERENTIAL  BASIC METABOLIC PANEL   No results found.   No diagnosis found.   Date: 07/06/2012  Rate: 128  Rhythm: sinus tachycardia  QRS Axis: normal  Intervals: normal   ST/T Wave abnormalities: normal  Conduction Disutrbances:none  Narrative Interpretation:   Old EKG Reviewed: unchanged  BP 109/63  Pulse 122  Temp 98.4 F (36.9 C) (Oral)  Resp 19  Ht 5\' 5"  (1.651 m)  Wt 110 lb (49.896 kg)  BMI 18.31 kg/m2  SpO2 100%  LMP 06/07/2012   MDM  21 year old female presents emergency department status post syncopal episode was likely etiology of hypoglycemic blackout.  No acute abnormality seen on exam including any focal neuro deficits.  No concern for closed head injury, fractures, or dislocations.  Patient with tachycardia on arrival which she states is chronic.  CBG 286, IVF given. No signs of DKA.  Pt UA has still not resulted and she requests dc any way. She is currently not having urinary symptoms and was told she will be called if UTI is present. Patient appears stable for discharge and is in no acute distress.  Recommend followup with primary physician in regards to today's hospital visit.  Patient verbalizes understanding and is agreeable with plan.  Chronic tachycardia: 8/7 HR-121, 7/20 HR-125, 5/2 HR-116, 4/24 119, 4/13 HR-128 etc. Pr denies any CP, SOB, Cough, clot history, hemoptysis or DOE. Tachycardia currently being followed by PCP. No further work up indicated at this time. Strict return precautions discussed if become symptomatic.         Jaci Carrel, [place] 07/06/12 1413

## 2012-07-06 NOTE — ED Notes (Signed)
MD at bedside.  EDPA present to reevaluate this pt 

## 2012-07-08 LAB — URINE CULTURE

## 2012-09-06 ENCOUNTER — Ambulatory Visit: Payer: 59 | Admitting: Internal Medicine

## 2012-09-13 ENCOUNTER — Ambulatory Visit: Payer: 59 | Admitting: Endocrinology

## 2013-02-02 ENCOUNTER — Other Ambulatory Visit: Payer: Self-pay

## 2013-02-02 MED ORDER — GLUCOSE BLOOD VI STRP: ORAL_STRIP | Status: DC

## 2013-02-06 ENCOUNTER — Encounter: Payer: Self-pay | Admitting: Endocrinology

## 2013-02-06 ENCOUNTER — Ambulatory Visit (INDEPENDENT_AMBULATORY_CARE_PROVIDER_SITE_OTHER): Payer: 59 | Admitting: Endocrinology

## 2013-02-06 ENCOUNTER — Telehealth: Payer: Self-pay

## 2013-02-06 VITALS — BP 118/76 | HR 76 | Ht 65.0 in | Wt 106.0 lb

## 2013-02-06 DIAGNOSIS — E1049 Type 1 diabetes mellitus with other diabetic neurological complication: Secondary | ICD-10-CM

## 2013-02-06 DIAGNOSIS — Z0000 Encounter for general adult medical examination without abnormal findings: Secondary | ICD-10-CM

## 2013-02-06 MED ORDER — "INSULIN SYRINGE-NEEDLE U-100 30G X 1/2"" 0.5 ML MISC": 1.0000 | Freq: Four times a day (QID) | Status: DC

## 2013-02-06 MED ORDER — GLUCOSE BLOOD VI STRP: ORAL_STRIP | Status: DC

## 2013-02-06 NOTE — Progress Notes (Signed)
  Subjective:    Patient ID: Chelsea Sparks, female    DOB: 1991-05-02, 22 y.o.   MRN: [id number]  HPI    Review of Systems     Objective:   Physical Exam        Assessment & Plan:  This insulin pump regimen was chosen from multiple options, as it best matches his insulin to his changing requirements throughout the day.  The benefits of glycemic control must be weighed against the risks of hypoglycemia.

## 2013-02-06 NOTE — Progress Notes (Signed)
  Subjective:    Patient ID: Chelsea Sparks, female    DOB: 01/22/1991, 22 y.o.   MRN: [id number]  HPI pt returns for f/u of type 1 DM (dx'ed 2002; she has moderate neuropathy of the lower extremities; she has associated gastroparesis; she does not want pump rx).  She lost her insurance recently, and has had no insulin for a few weeks.  She has polyuria and polydipsia.  She just regained her insurance a few days ago.   Past Medical History  Diagnosis Date  . Diabetes mellitus     Type 1  . Hypertension age 80  . Panic attacks   . Anxiety     Past Surgical History  Procedure Laterality Date  . No past surgeries    . Cystoscopy      History   Social History  . Marital Status: Single    Spouse Name: N/A    Number of Children: 0  . Years of Education: N/A   Occupational History  .     Social History Main Topics  . Smoking status: Never Smoker   . Smokeless tobacco: Never Used  . Alcohol Use: No  . Drug Use: No  . Sexually Active: No   Other Topics Concern  . Not on file   Social History Narrative  . No narrative on file    Current Outpatient Prescriptions on File Prior to Visit  Medication Sig Dispense Refill  . acetaminophen (TYLENOL) 325 MG tablet Take 650 mg by mouth every 6 (six) hours as needed. FOR PAIN      . amitriptyline (ELAVIL) 10 MG tablet Take 1 tablet (10 mg total) by mouth at bedtime.  90 tablet  1  . RELION INSULIN SYR 0.5ML/31G 31G X 5/16" 0.5 ML MISC        No current facility-administered medications on file prior to visit.   No Known Allergies  Family History  Problem Relation Age of Onset  . Diabetes Father   . Diabetes Maternal Grandmother   . Breast cancer Maternal Aunt   . Heart attack Maternal Grandfather    BP 118/76  Pulse 76  Ht 5\' 5"  (1.651 m)  Wt 106 lb (48.081 kg)  BMI 17.64 kg/m2  SpO2 98%  Review of Systems Denies n/v/sob    Objective:   Physical Exam VITAL SIGNS:  See vs page GENERAL: no distress      Assessment & Plan:  DM: therapy limited by noncompliance with insulin injections.  i'll do the best i can.

## 2013-02-06 NOTE — Telephone Encounter (Signed)
Pt's cbg in the office this am is 434.

## 2013-02-06 NOTE — Patient Instructions (Addendum)
check your blood sugar 4 times a day--before the 3 meals, and at bedtime.  also check if you have symptoms of your blood sugar being too high or too low.  please keep a record of the readings and bring it to your next appointment here.  please call us sooner if your blood sugar goes below 70, or if it stays over 200. For now, change the lantus to levemir, 15 units at bedtime, and:  Take novolog 3x a day (just before each meal) 2-4-5 units.  You can vary this by 1 unit either way, depending on the size of the meal. Please come back for a follow-up appointment in 2 weeks.

## 2013-03-06 ENCOUNTER — Emergency Department (HOSPITAL_COMMUNITY)
Admission: EM | Admit: 2013-03-06 | Discharge: 2013-03-06 | Disposition: A | Discharge status: Home / Self Care | Payer: 59 | Attending: Emergency Medicine | Admitting: Emergency Medicine

## 2013-03-06 ENCOUNTER — Encounter (HOSPITAL_COMMUNITY): Payer: Self-pay | Admitting: Emergency Medicine

## 2013-03-06 DIAGNOSIS — R197 Diarrhea, unspecified: Secondary | ICD-10-CM

## 2013-03-06 DIAGNOSIS — R000 Tachycardia, unspecified: Secondary | ICD-10-CM | POA: Insufficient documentation

## 2013-03-06 DIAGNOSIS — Z3202 Encounter for pregnancy test, result negative: Secondary | ICD-10-CM | POA: Insufficient documentation

## 2013-03-06 DIAGNOSIS — Z794 Long term (current) use of insulin: Secondary | ICD-10-CM | POA: Insufficient documentation

## 2013-03-06 DIAGNOSIS — I10 Essential (primary) hypertension: Secondary | ICD-10-CM | POA: Insufficient documentation

## 2013-03-06 DIAGNOSIS — E109 Type 1 diabetes mellitus without complications: Secondary | ICD-10-CM | POA: Insufficient documentation

## 2013-03-06 DIAGNOSIS — R109 Unspecified abdominal pain: Secondary | ICD-10-CM | POA: Insufficient documentation

## 2013-03-06 DIAGNOSIS — Z8659 Personal history of other mental and behavioral disorders: Secondary | ICD-10-CM | POA: Insufficient documentation

## 2013-03-06 DIAGNOSIS — R112 Nausea with vomiting, unspecified: Secondary | ICD-10-CM | POA: Insufficient documentation

## 2013-03-06 DIAGNOSIS — N390 Urinary tract infection, site not specified: Secondary | ICD-10-CM | POA: Insufficient documentation

## 2013-03-06 LAB — URINALYSIS, ROUTINE W REFLEX MICROSCOPIC
Bilirubin Urine: NEGATIVE
Glucose, UA: >1000 mg/dL — AB
Nitrite: NEGATIVE
Specific Gravity, Urine: 1.03 (ref 1.005–1.030)
pH: 6 (ref 5.0–8.0)

## 2013-03-06 LAB — POCT I-STAT, CHEM 8
BUN: 17 mg/dL (ref 6–23)
Hemoglobin: 14.6 g/dL (ref 12.0–15.0)
Potassium: 3.9 mEq/L (ref 3.5–5.1)
Sodium: 140 mEq/L (ref 135–145)
TCO2: 26 mmol/L (ref 0–100)

## 2013-03-06 LAB — GLUCOSE, CAPILLARY: Glucose-Capillary: 162 mg/dL — ABNORMAL HIGH (ref 70–99)

## 2013-03-06 LAB — URINE MICROSCOPIC-ADD ON

## 2013-03-06 MED ORDER — LORAZEPAM 2 MG/ML IJ SOLN
1.0000 mg | Freq: Once | INTRAMUSCULAR | Status: AC
  Administered 2013-03-06: 1 mg via INTRAVENOUS
  Filled 2013-03-06: qty 1

## 2013-03-06 MED ORDER — METOCLOPRAMIDE HCL 5 MG/ML IJ SOLN
10.0000 mg | Freq: Once | INTRAMUSCULAR | Status: AC
  Administered 2013-03-06: 10 mg via INTRAVENOUS
  Filled 2013-03-06: qty 2

## 2013-03-06 MED ORDER — METOCLOPRAMIDE HCL 10 MG PO TABS: 10.0000 mg | ORAL_TABLET | Freq: Four times a day (QID) | ORAL | Status: DC

## 2013-03-06 MED ORDER — SODIUM CHLORIDE 0.9 % IV BOLUS (SEPSIS)
1000.0000 mL | Freq: Once | INTRAVENOUS | Status: AC
  Administered 2013-03-06: 1000 mL via INTRAVENOUS

## 2013-03-06 MED ORDER — NITROFURANTOIN MONOHYD MACRO 100 MG PO CAPS: 100.0000 mg | ORAL_CAPSULE | Freq: Two times a day (BID) | ORAL | Status: DC

## 2013-03-06 MED ORDER — ONDANSETRON HCL 4 MG/2ML IJ SOLN: 4.0000 mg | Freq: Once | INTRAMUSCULAR | Status: DC

## 2013-03-06 MED ORDER — ONDANSETRON HCL 4 MG/2ML IJ SOLN
4.0000 mg | Freq: Once | INTRAMUSCULAR | Status: AC
  Administered 2013-03-06: 4 mg via INTRAVENOUS
  Filled 2013-03-06: qty 2

## 2013-03-06 NOTE — ED Notes (Signed)
Pt given diet ginger ale per pt's request, pt sts still has some nausea and dry heaving.

## 2013-03-06 NOTE — ED Notes (Signed)
Pt states she has been having n/v/d since 7pm last night. Pt CBG was 212 at 12am this morning.

## 2013-03-06 NOTE — ED Provider Notes (Signed)
Medical screening examination/treatment/procedure(s) were performed by non-physician practitioner and as supervising physician I was immediately available for consultation/collaboration.   Dione Booze, MD 03/06/13 2300

## 2013-03-06 NOTE — ED Provider Notes (Signed)
History    CSN: [id number] Arrival date & time 03/06/13  0616  First MD Initiated Contact with Patient 03/06/13 [phone]     Chief Complaint  Patient presents with  . Emesis  . Nausea  . Diarrhea   (Consider location/radiation/quality/duration/timing/severity/associated sxs/prior Treatment) HPI Comments: Patient with a history of Type I DM presents today with a chief complaint of nausea, vomiting, and diarrhea.  She reports that her symptoms began last evening at 7 PM.  She states that she has had four episodes of vomiting and three episodes of diarrhea.  No blood in her emesis or blood in her stool.  She denies fever or chills.  She is complaining of diffuse abdominal cramping.  She denies urinary symptoms.  Denies vaginal discharge.  She has taken antacids for her symptoms without relief.  She reports that she has been compliant with her insulin and has been checking her blood sugar.  She states that her blood sugar was 129 at dinner last evening and 212 before bed.  She denies any sick contacts.    Patient is a 22 y.o. female presenting with vomiting and diarrhea. The history is provided by the patient.  Emesis Associated symptoms: abdominal pain and diarrhea   Associated symptoms: no chills   Diarrhea Associated symptoms: abdominal pain and vomiting   Associated symptoms: no chills and no fever    Past Medical History  Diagnosis Date  . Diabetes mellitus     Type 1  . Hypertension age 54  . Panic attacks   . Anxiety    Past Surgical History  Procedure Laterality Date  . No past surgeries    . Cystoscopy     Family History  Problem Relation Age of Onset  . Diabetes Father   . Diabetes Maternal Grandmother   . Breast cancer Maternal Aunt   . Heart attack Maternal Grandfather    History  Substance Use Topics  . Smoking status: Never Smoker   . Smokeless tobacco: Never Used  . Alcohol Use: No   OB History   Grav Para Term Preterm Abortions TAB SAB Ect Mult Living   0               Review of Systems  Constitutional: Negative for fever and chills.  Gastrointestinal: Positive for nausea, vomiting, abdominal pain and diarrhea. Negative for constipation, blood in stool and abdominal distention.  All other systems reviewed and are negative.    Allergies  Review of patient's allergies indicates no known allergies.  Home Medications   Current Outpatient Rx  Name  Route  Sig  Dispense  Refill  . acetaminophen (TYLENOL) 325 MG tablet   Oral   Take 650 mg by mouth every 6 (six) hours as needed. FOR PAIN         . EXPIRED: amitriptyline (ELAVIL) 10 MG tablet   Oral   Take 1 tablet (10 mg total) by mouth at bedtime.   90 tablet   1   . glucose blood test strip      And lancets 4/day 250.61   120 each   11   . insulin aspart (NOVOLOG) 100 UNIT/ML injection      3x a day (just before each meal) 2-4-5 units, and syringes 4/day.         . insulin detemir (LEVEMIR) 100 UNIT/ML injection   Subcutaneous   Inject 15 Units into the skin at bedtime.         . Insulin Syringe-Needle  U-100 30G X 1/2" 0.5 ML MISC   Does not apply   1 Syringe by Does not apply route 4 (four) times daily.   120 each   11   . RELION INSULIN SYR 0.5ML/31G 31G X 5/16" 0.5 ML MISC                BP 111/79  Pulse 122  Temp(Src) 98.1 F (36.7 C) (Oral)  Resp 17  SpO2 99%  LMP 02/08/2013 Physical Exam  Nursing note and vitals reviewed. Constitutional: She appears well-developed and well-nourished.  HENT:  Head: Normocephalic and atraumatic.  Neck: Normal range of motion. Neck supple.  Cardiovascular: Regular rhythm and normal heart sounds.  Tachycardia present.   Pulmonary/Chest: Effort normal and breath sounds normal.  Abdominal: Soft. Bowel sounds are normal. She exhibits no distension and no mass. There is tenderness. There is no rebound and no guarding.  Mild diffuse tenderness to palpation  Neurological: She is alert.  Skin: Skin is warm and dry.   Psychiatric: She has a normal mood and affect.    ED Course  Procedures (including critical care time) Labs Reviewed  URINALYSIS, ROUTINE W REFLEX MICROSCOPIC  PREGNANCY, URINE   No results found. No diagnosis found.  7:19 AM Reassessed patient.  She reports that her nausea has not improved.  She has not had any episodes of vomiting since given Zofran.  Will order Reglan IV and reassess.  7:55 AM Reassessed patient.  Patient reports that she continues to feel nauseous and is actively dry heaving.  Will order another dose of Zofran and reassess.  10:25 AM Reassessed patient.  She reports that her nausea has improved at this time.  She has not had any episodes of vomiting since arrival in the ED.  Patient able to tolerate PO liquids.    MDM  Patient presenting with nausea, vomiting, diarrhea, and diffuse abdominal cramping.  Labs unremarkable.  Urine pregnancy negative.  Symptoms improved after given several rounds of antiemetics.  Patient is afebrile.  No localized tenderness on abdominal exam.  Therefore, feel that the patient is stable for discharge.  Return precautions given.  Patient also found to be tachycardic.  However, review of the chart shows that the tachycardia is a chronic problem.  PCP currently following tachycardia.    Pascal Lux [place], PA-C 03/06/13 1704

## 2013-03-07 LAB — URINE CULTURE

## 2013-03-17 ENCOUNTER — Telehealth: Payer: Self-pay | Admitting: Endocrinology

## 2013-03-17 NOTE — Telephone Encounter (Signed)
Rec'd from Disability Determination Service forward 6 pages to Dr.Ellison

## 2013-08-08 IMAGING — CR DG ABDOMEN 2V
2 series · 2 of 2 positions shown · non-contrast
Comparison: Lumbar spine radiographs performed 03/01/2007

CLINICAL DATA: Upper abdominal pain for 1 month; nausea.

ABDOMEN - 2 VIEW

[w abdomen upright]
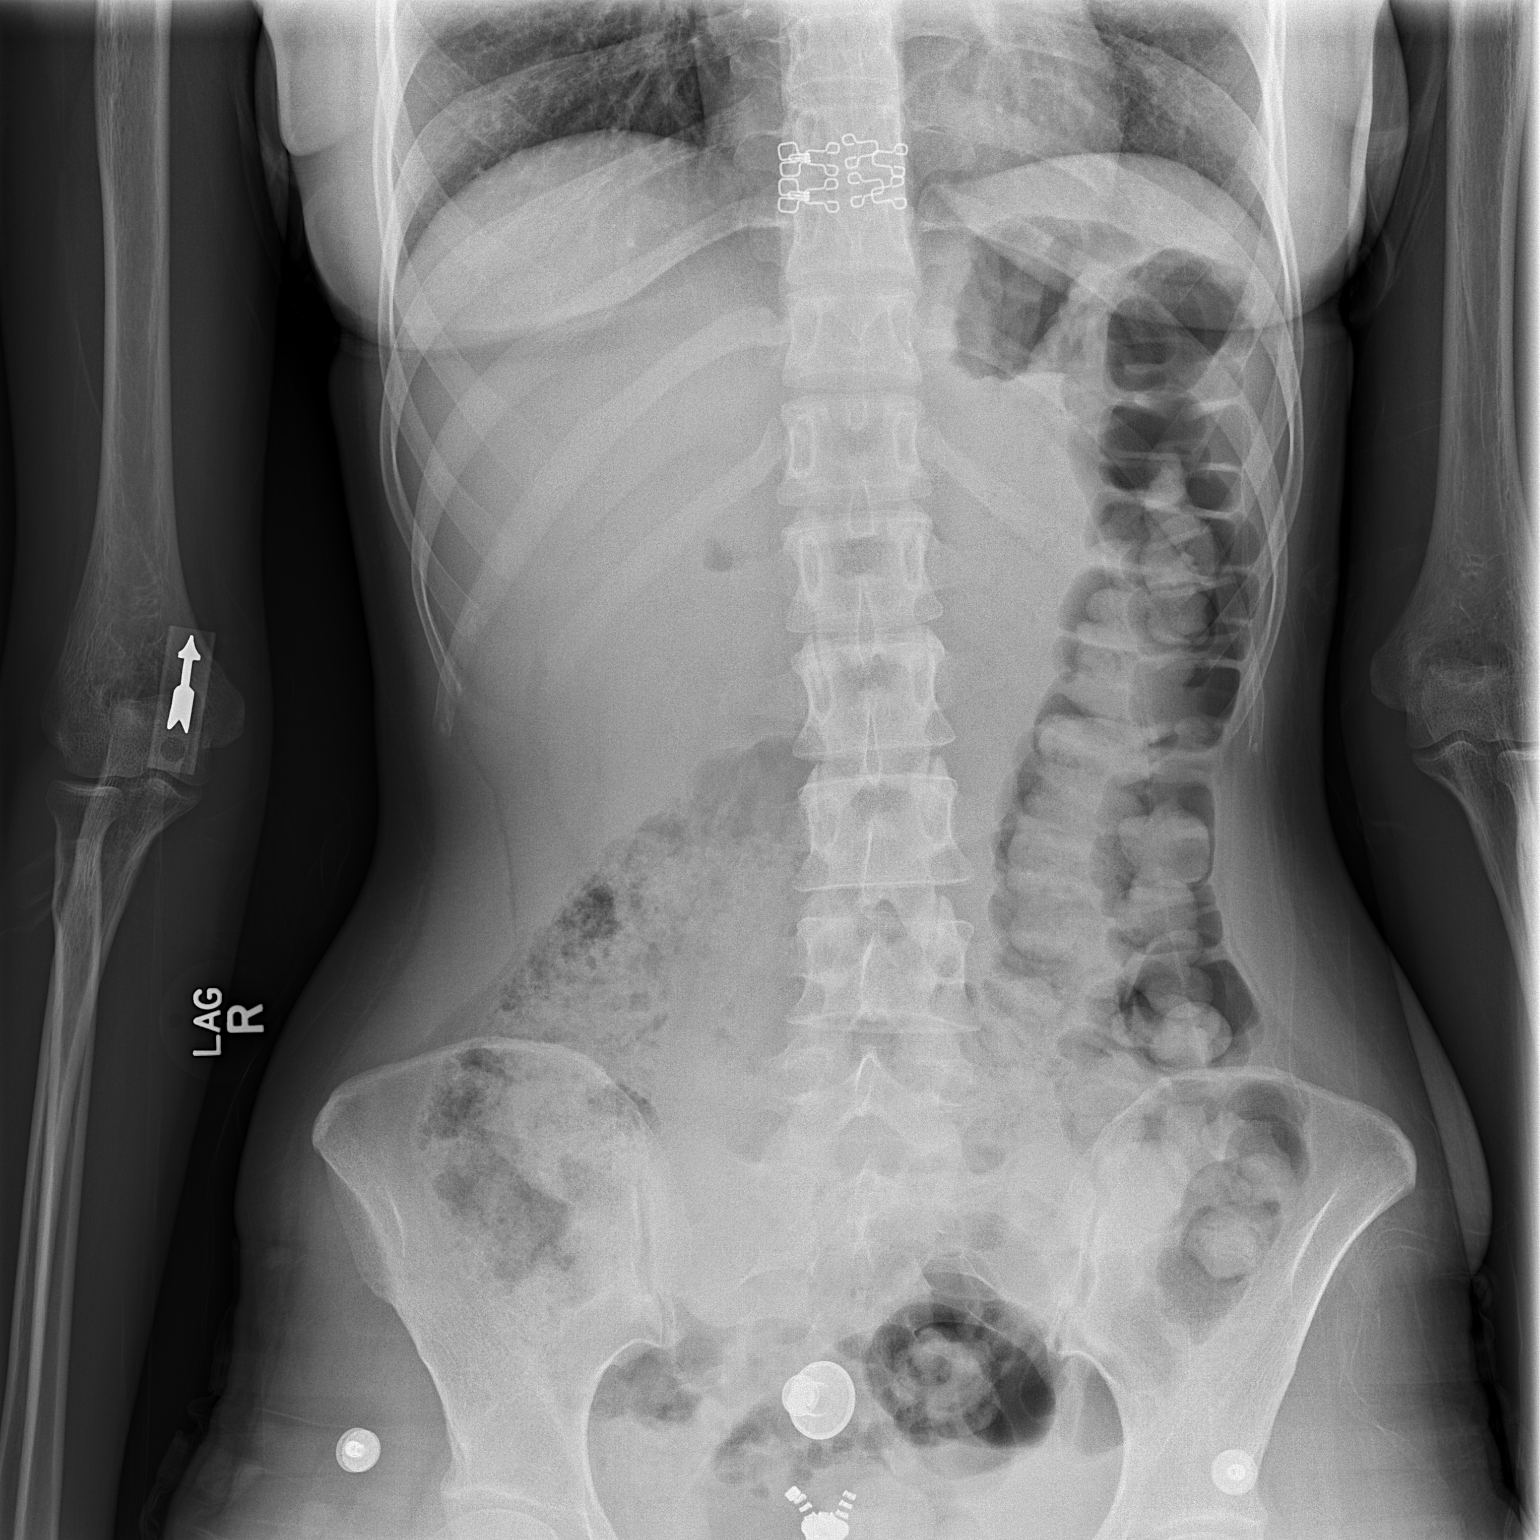

[t abdomen supine]
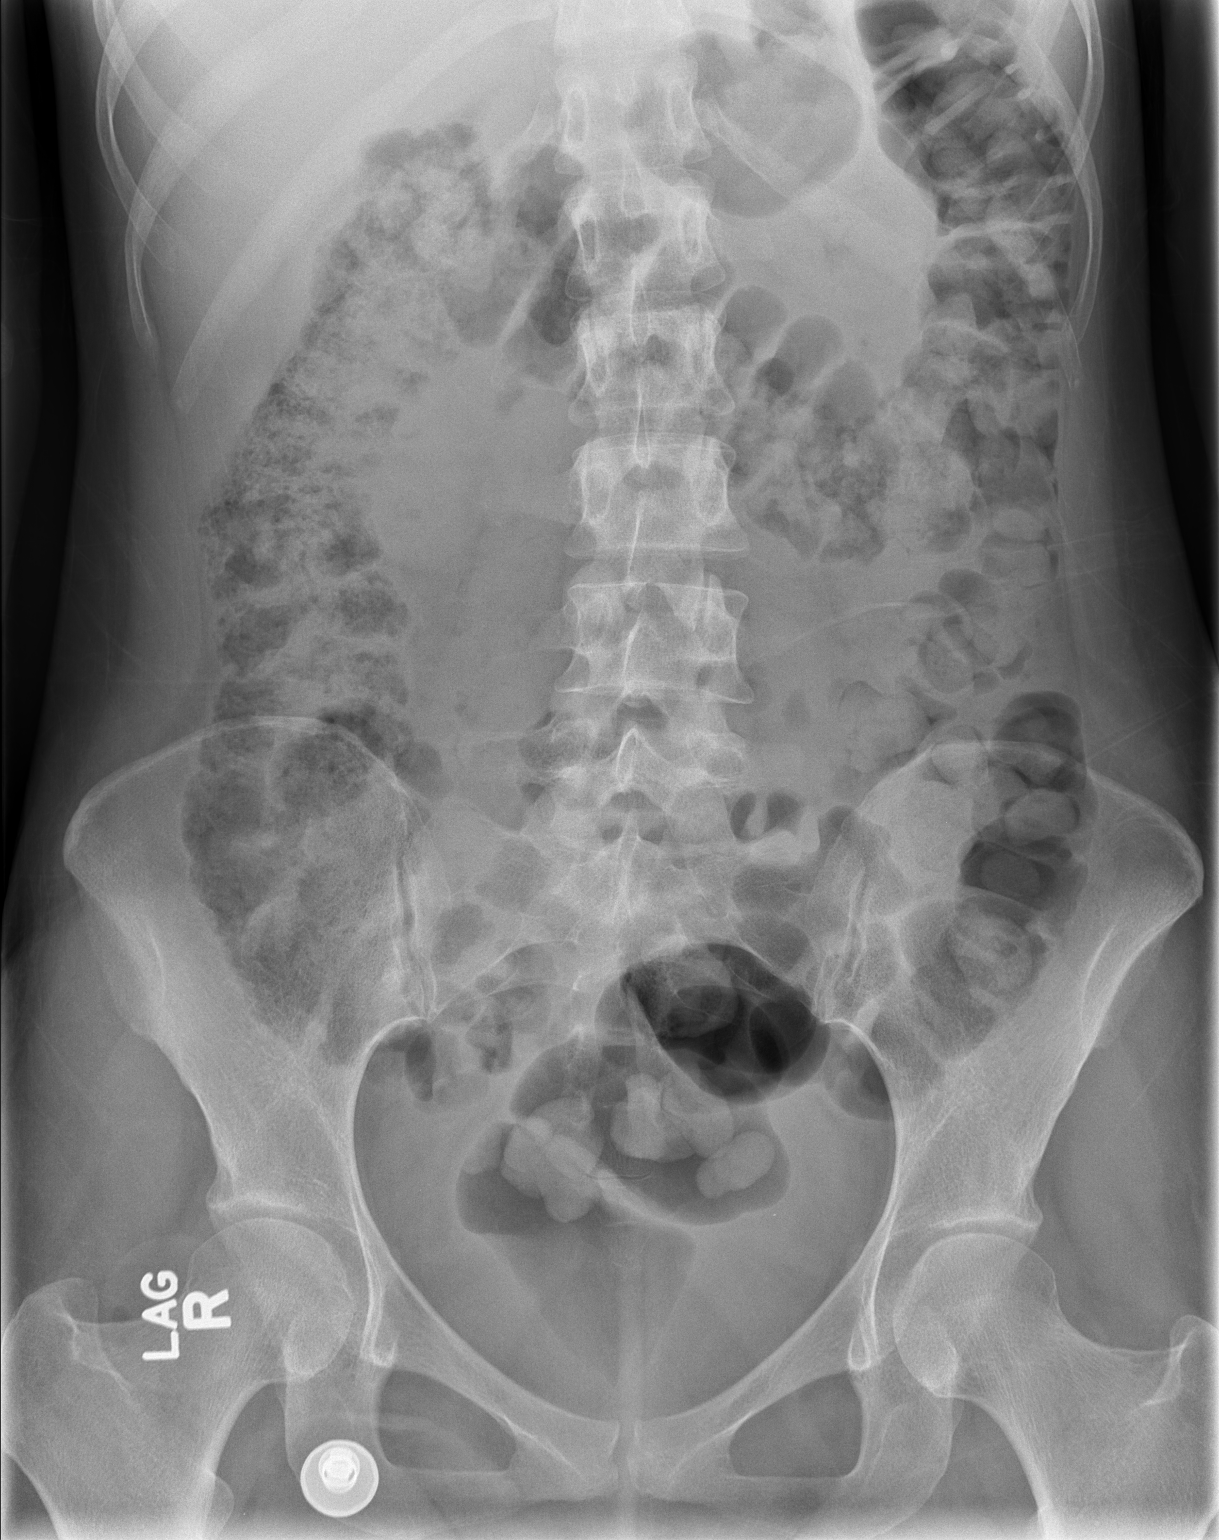

[2 of 2 positions shown; findings below may reference images not displayed]

FINDINGS: The visualized bowel gas pattern is unremarkable.  The
colon is partially filled with stool; no abnormal dilatation of
small bowel loops is seen to suggest small bowel obstruction.  No
free intra-abdominal air is identified on the provided upright
view.

The visualized osseous structures are within normal limits; the
sacroiliac joints are unremarkable in appearance.  The visualized
lung bases are essentially clear.
IMPRESSION: Unremarkable bowel gas pattern; no free intra-abdominal air seen.
Colon partially filled with stool, without significant
constipation.

## 2013-08-15 IMAGING — CT CT ABD-PELV W/ CM
2 of 4 series · 17 of 46 positions shown, 19 images · IV contrast (Omnipaque 300)
Comparison: Radiography [DATE]

CLINICAL DATA: Weight loss.  Generalized abdominal pain.

CT ABDOMEN AND PELVIS WITH CONTRAST
TECHNIQUE: Multidetector CT imaging of the abdomen and pelvis was
performed following the standard protocol during bolus
administration of intravenous contrast.
Contrast: 80mL OMNIPAQUE IOHEXOL 300 MG/ML  SOLN

[Series 2: abd/ pel 5mm · axial · 0.55mm/px · z∈[-472,-57]mm · 14 of 91 slices shown, 16 images]
[im 4/91  soft-tissue]
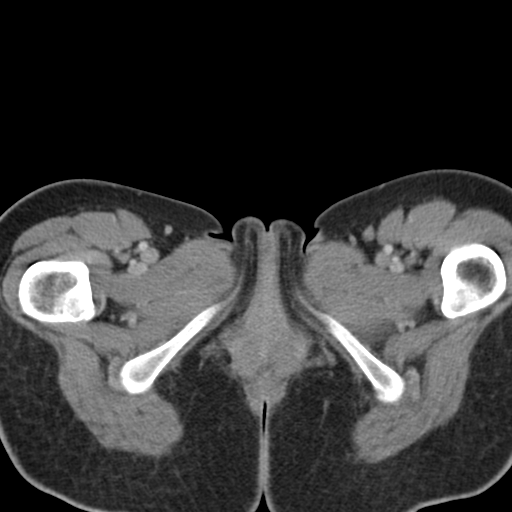
[im 4/91  bone]
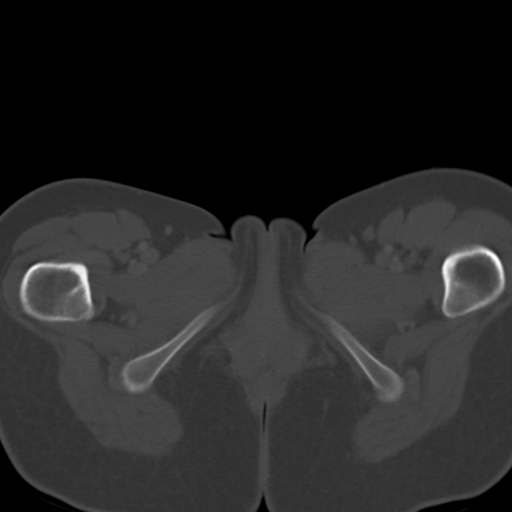
[im 12/91  soft-tissue]
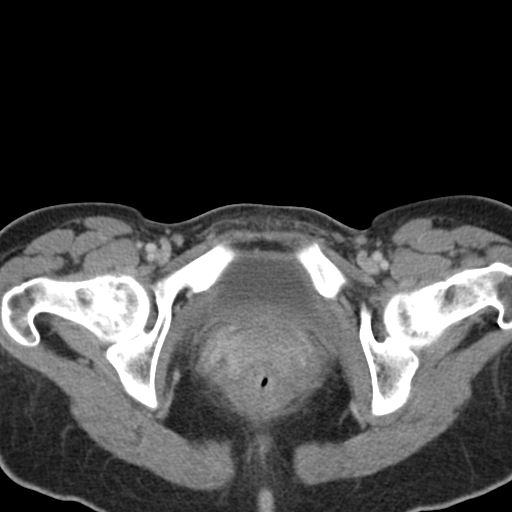
[im 19/91  soft-tissue]
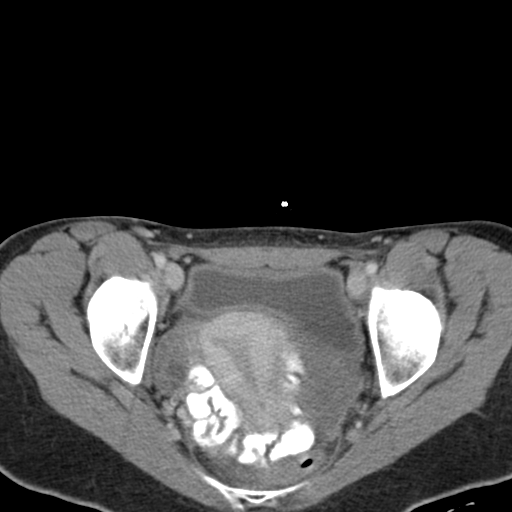
[im 23/91  soft-tissue]
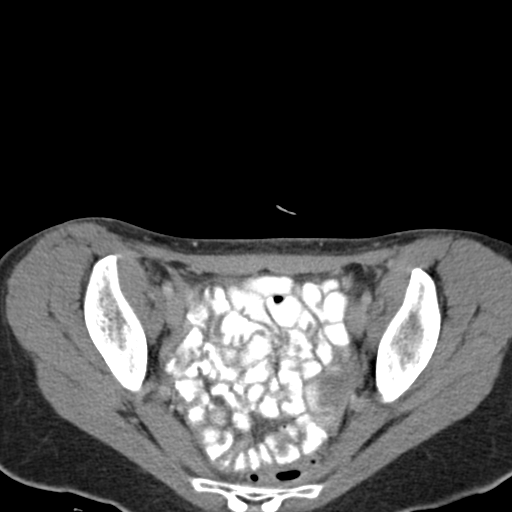
[im 31/91  soft-tissue]
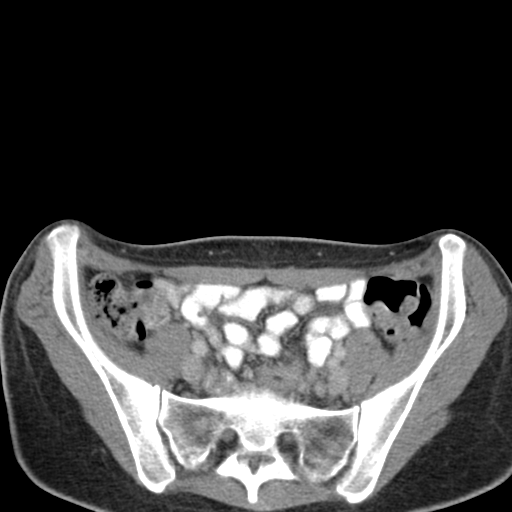
[im 38/91  soft-tissue]
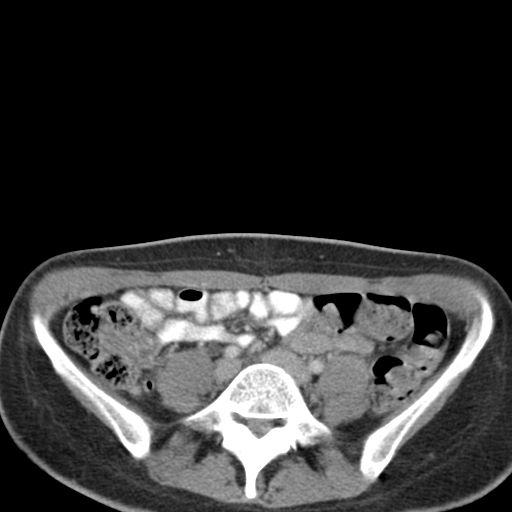
[im 42/91  soft-tissue]
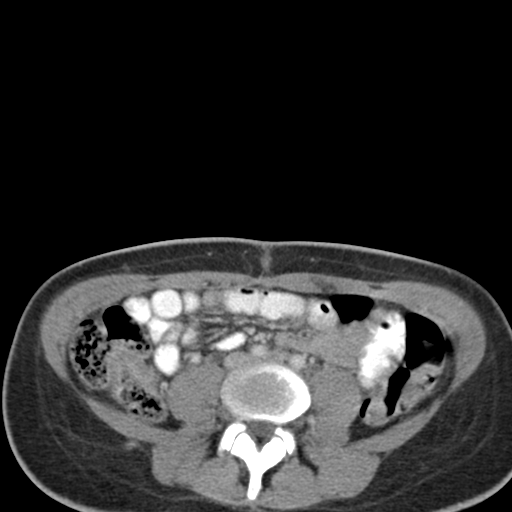
[im 49/91  soft-tissue]
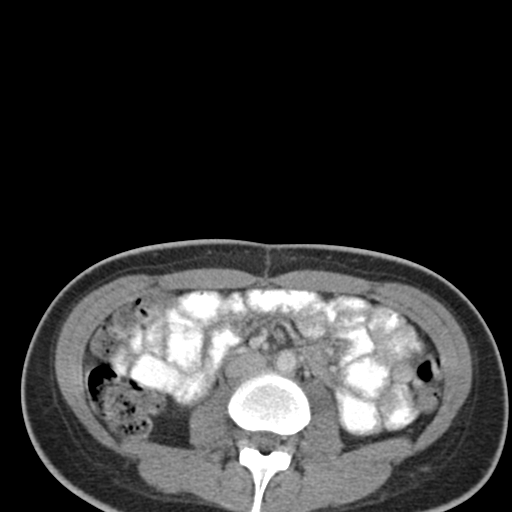
[im 53/91  soft-tissue]
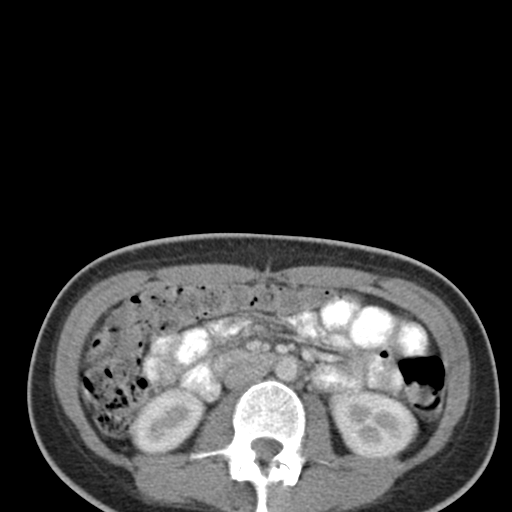
[im 53/91  bone]
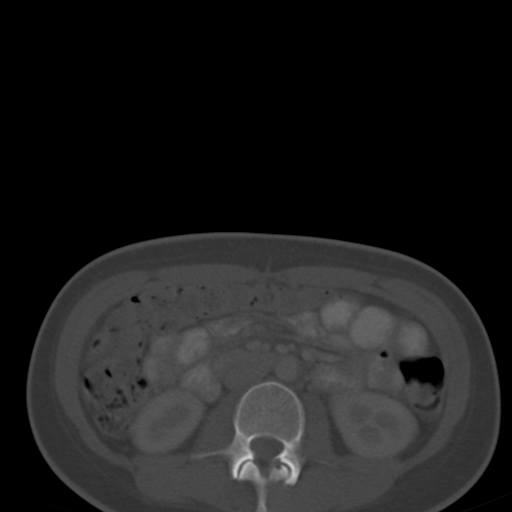
[im 61/91  soft-tissue]
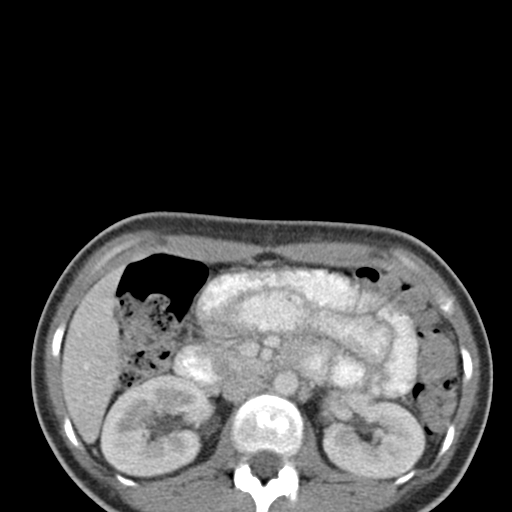
[im 68/91  soft-tissue]
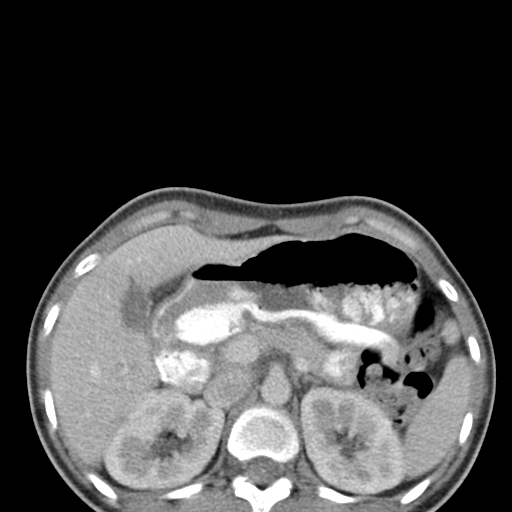
[im 72/91  soft-tissue]
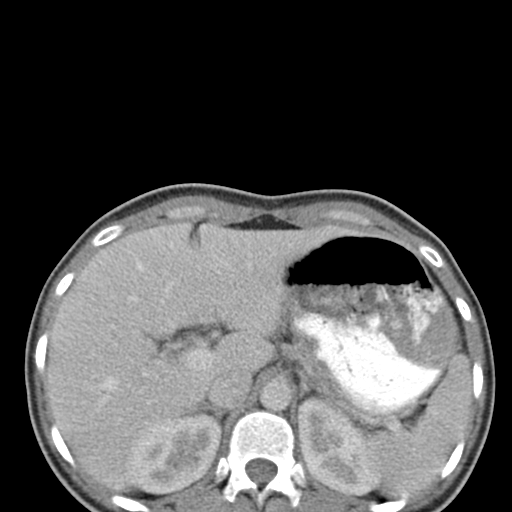
[im 79/91  soft-tissue]
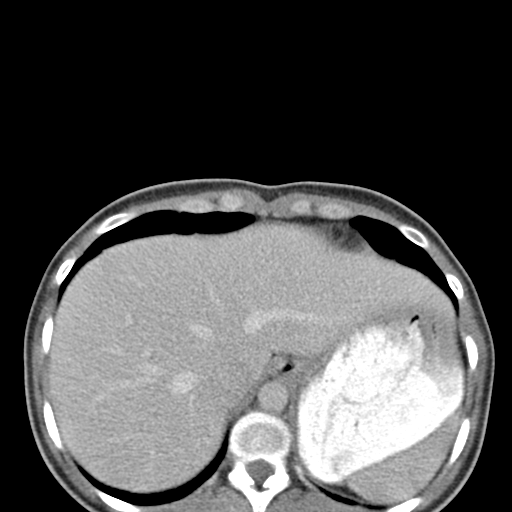
[im 87/91  soft-tissue]
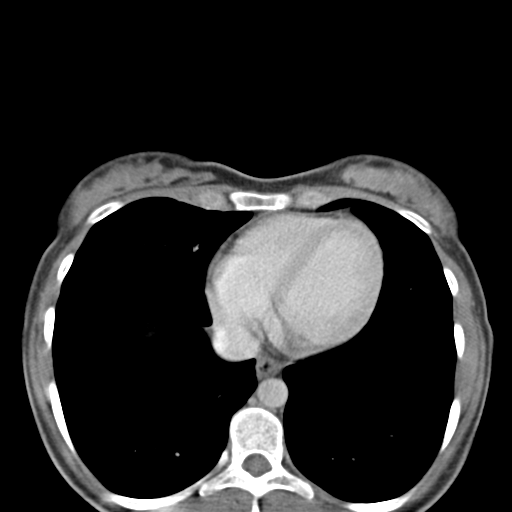

[Series 602: cor · coronal · 0.91mm/px · 3 of 88 slices shown]
[im 30/88  soft-tissue]
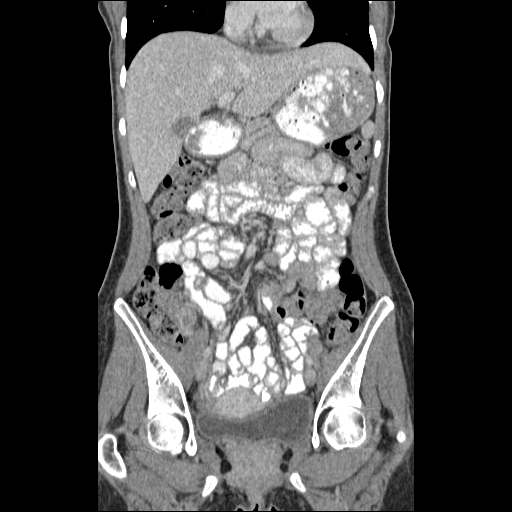
[im 39/88  soft-tissue]
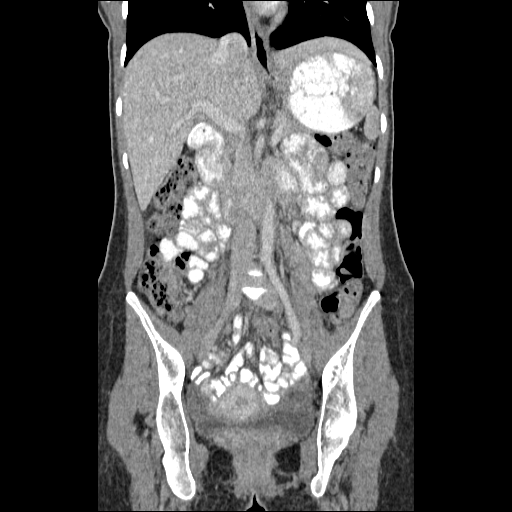
[im 49/88  soft-tissue]
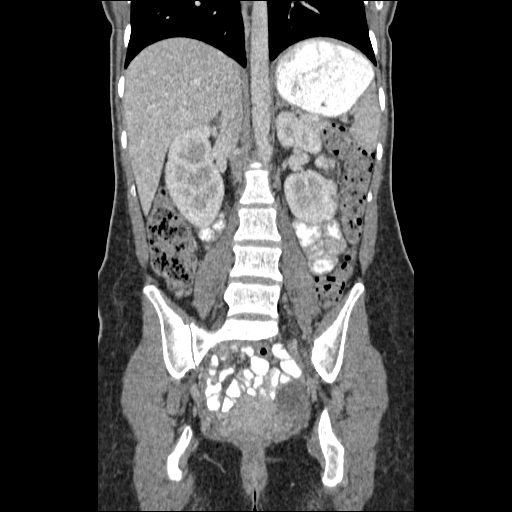

[17 of 46 positions shown; findings below may reference images not displayed]

FINDINGS: Lung bases are clear.  No pleural or pericardial fluid.

The liver has a normal appearance without focal lesions or biliary
ductal dilatation.  No calcified gallstones.  The spleen is normal.
The pancreas is normal.  The adrenal glands are normal.  The
kidneys are normal.  No cyst, mass, stone or hydronephrosis.  The
aorta and IVC are normal.  No retroperitoneal mass or adenopathy.
No free intraperitoneal fluid or air.  No bowel pathology is seen.
The uterus appears normal.  The left ovary is slightly prominent,
measuring 4.5 x 3.8 by 3.8 cm.  There is probably a dominant cyst
on the order of 3 cm in size.  The ovary on the right appears
within normal limits measuring 3.7 x 1.9 by 2.5 cm.   No
significant bony finding.
IMPRESSION: No pathologic finding the abdominal portion of the scan.  No cause
of pain identified.

Left ovary is prominent, probably containing a 3 cm cyst.  This is
not optimally evaluated by CT.

## 2014-03-21 ENCOUNTER — Emergency Department (HOSPITAL_COMMUNITY): Payer: No Typology Code available for payment source

## 2014-03-21 ENCOUNTER — Emergency Department (HOSPITAL_COMMUNITY)
Admission: EM | Admit: 2014-03-21 | Discharge: 2014-03-21 | Disposition: A | Discharge status: Home / Self Care | Payer: No Typology Code available for payment source | Attending: Emergency Medicine | Admitting: Emergency Medicine

## 2014-03-21 ENCOUNTER — Encounter (HOSPITAL_COMMUNITY): Payer: Self-pay | Admitting: Emergency Medicine

## 2014-03-21 DIAGNOSIS — Z79899 Other long term (current) drug therapy: Secondary | ICD-10-CM | POA: Insufficient documentation

## 2014-03-21 DIAGNOSIS — Z8659 Personal history of other mental and behavioral disorders: Secondary | ICD-10-CM | POA: Insufficient documentation

## 2014-03-21 DIAGNOSIS — R109 Unspecified abdominal pain: Secondary | ICD-10-CM

## 2014-03-21 DIAGNOSIS — R6883 Chills (without fever): Secondary | ICD-10-CM | POA: Insufficient documentation

## 2014-03-21 DIAGNOSIS — R1011 Right upper quadrant pain: Secondary | ICD-10-CM | POA: Insufficient documentation

## 2014-03-21 DIAGNOSIS — R110 Nausea: Secondary | ICD-10-CM | POA: Insufficient documentation

## 2014-03-21 DIAGNOSIS — I10 Essential (primary) hypertension: Secondary | ICD-10-CM | POA: Insufficient documentation

## 2014-03-21 DIAGNOSIS — M549 Dorsalgia, unspecified: Secondary | ICD-10-CM | POA: Insufficient documentation

## 2014-03-21 DIAGNOSIS — E109 Type 1 diabetes mellitus without complications: Secondary | ICD-10-CM | POA: Insufficient documentation

## 2014-03-21 DIAGNOSIS — R3915 Urgency of urination: Secondary | ICD-10-CM | POA: Insufficient documentation

## 2014-03-21 DIAGNOSIS — Z3202 Encounter for pregnancy test, result negative: Secondary | ICD-10-CM | POA: Insufficient documentation

## 2014-03-21 DIAGNOSIS — Z87440 Personal history of urinary (tract) infections: Secondary | ICD-10-CM | POA: Insufficient documentation

## 2014-03-21 DIAGNOSIS — Z794 Long term (current) use of insulin: Secondary | ICD-10-CM | POA: Insufficient documentation

## 2014-03-21 DIAGNOSIS — R300 Dysuria: Secondary | ICD-10-CM | POA: Insufficient documentation

## 2014-03-21 LAB — URINALYSIS, ROUTINE W REFLEX MICROSCOPIC
BILIRUBIN URINE: NEGATIVE
GLUCOSE, UA: NEGATIVE mg/dL
HGB URINE DIPSTICK: NEGATIVE
KETONES UR: NEGATIVE mg/dL
Leukocytes, UA: NEGATIVE
Nitrite: NEGATIVE
PH: 5.5 (ref 5.0–8.0)
Protein, ur: NEGATIVE mg/dL
SPECIFIC GRAVITY, URINE: 1.007 (ref 1.005–1.030)
Urobilinogen, UA: 0.2 mg/dL (ref 0.0–1.0)

## 2014-03-21 LAB — COMPREHENSIVE METABOLIC PANEL
ALK PHOS: 63 U/L (ref 39–117)
ALT: 10 U/L (ref 0–35)
AST: 13 U/L (ref 0–37)
Albumin: 4.2 g/dL (ref 3.5–5.2)
Anion gap: 12 (ref 5–15)
BUN: 11 mg/dL (ref 6–23)
CO2: 23 meq/L (ref 19–32)
Calcium: 9.8 mg/dL (ref 8.4–10.5)
Chloride: 102 mEq/L (ref 96–112)
Creatinine, Ser: 0.52 mg/dL (ref 0.50–1.10)
GFR calc Af Amer: >90 mL/min (ref >90)
GLUCOSE: 149 mg/dL — AB (ref 70–99)
POTASSIUM: 4.6 meq/L (ref 3.7–5.3)
Sodium: 137 mEq/L (ref 137–147)
TOTAL PROTEIN: 8 g/dL (ref 6.0–8.3)
Total Bilirubin: 0.6 mg/dL (ref 0.3–1.2)

## 2014-03-21 LAB — CBC WITH DIFFERENTIAL/PLATELET
BASOS ABS: 0 10*3/uL (ref 0.0–0.1)
Basophils Relative: 0 % (ref 0–1)
EOS PCT: 1 % (ref 0–5)
Eosinophils Absolute: 0.1 10*3/uL (ref 0.0–0.7)
HCT: 38.5 % (ref 36.0–46.0)
Hemoglobin: 12.7 g/dL (ref 12.0–15.0)
Lymphocytes Relative: 24 % (ref 12–46)
Lymphs Abs: 2.1 10*3/uL (ref 0.7–4.0)
MCH: 28.1 pg (ref 26.0–34.0)
MCHC: 33 g/dL (ref 30.0–36.0)
MCV: 85.2 fL (ref 78.0–100.0)
Monocytes Absolute: 0.7 10*3/uL (ref 0.1–1.0)
Monocytes Relative: 8 % (ref 3–12)
Neutro Abs: 5.9 10*3/uL (ref 1.7–7.7)
Neutrophils Relative %: 67 % (ref 43–77)
PLATELETS: 333 10*3/uL (ref 150–400)
RBC: 4.52 MIL/uL (ref 3.87–5.11)
RDW: 12.9 % (ref 11.5–15.5)
WBC: 8.9 10*3/uL (ref 4.0–10.5)

## 2014-03-21 LAB — LIPASE, BLOOD: Lipase: 13 U/L (ref 11–59)

## 2014-03-21 LAB — POC URINE PREG, ED: PREG TEST UR: NEGATIVE

## 2014-03-21 NOTE — ED Notes (Signed)
MD at bedside. 

## 2014-03-21 NOTE — ED Notes (Signed)
Pt states yesterday started having lower abdominal pain radiating around to sides, and urinary frequency, denies n/v/d.

## 2014-03-21 NOTE — ED Provider Notes (Signed)
CSN: [id number]     Arrival date & time 03/21/14  1335 History   First MD Initiated Contact with Patient 03/21/14 1542     Chief Complaint  Patient presents with  . Abdominal Pain  . Urinary Frequency     (Consider location/radiation/quality/duration/timing/severity/associated sxs/prior Treatment) HPI 23 year old female with a history of multiple UTIs per year presents feeling she has another UTI. Her symptoms started about one week ago with urinary discomfort and trouble fully emptying her bladder. She's also started about some lower abdominal pain bilaterally and some bilateral back pain is consistent with prior kidney infections. Denies a fevers but felt chills yesterday. Has felt nauseous this morning but this is improving. Denies any blood in her urine. Denies any vaginal discharge or bleeding. Denies any concerns for STD as she has not had sex in over 5 years.  Past Medical History  Diagnosis Date  . Diabetes mellitus     Type 1  . Hypertension age 32  . Panic attacks   . Anxiety    Past Surgical History  Procedure Laterality Date  . No past surgeries    . Cystoscopy     Family History  Problem Relation Age of Onset  . Diabetes Father   . Diabetes Maternal Grandmother   . Breast cancer Maternal Aunt   . Heart attack Maternal Grandfather    History  Substance Use Topics  . Smoking status: Never Smoker   . Smokeless tobacco: Never Used  . Alcohol Use: No   OB History   Grav Para Term Preterm Abortions TAB SAB Ect Mult Living   0              Review of Systems  Constitutional: Positive for chills. Negative for fever.  Gastrointestinal: Positive for nausea and abdominal pain. Negative for vomiting.  Genitourinary: Positive for dysuria, urgency and frequency. Negative for hematuria, vaginal bleeding and vaginal discharge.  Musculoskeletal: Positive for back pain.  All other systems reviewed and are negative.     Allergies  Review of patient's allergies  indicates no known allergies.  Home Medications   Prior to Admission medications   Medication Sig Start Date End Date Taking? Authorizing Provider  insulin aspart (NOVOLOG) 100 UNIT/ML injection Inject 2-5 Units into the skin 3 (three) times daily before meals.     Historical Provider, MD  insulin detemir (LEVEMIR) 100 UNIT/ML injection Inject 12 Units into the skin at bedtime.     Historical Provider, MD  metoCLOPramide (REGLAN) 10 MG tablet Take 1 tablet (10 mg total) by mouth every 6 (six) hours. Take every 6 hours as needed for nausea. 03/06/13   Heather Laisure, PA-C  nitrofurantoin, macrocrystal-monohydrate, (MACROBID) 100 MG capsule Take 1 capsule (100 mg total) by mouth 2 (two) times daily. 03/06/13   Heather Laisure, PA-C   BP 111/74  Pulse 101  Temp(Src) 98.6 F (37 C) (Oral)  Resp 16  Ht 5\' 5"  (1.651 m)  Wt 120 lb (54.432 kg)  BMI 19.97 kg/m2  SpO2 100%  LMP 03/12/2014 Physical Exam  Nursing note and vitals reviewed. Constitutional: She is oriented to person, place, and time. She appears well-developed and well-nourished. No distress.  HENT:  Head: Normocephalic and atraumatic.  Right Ear: External ear normal.  Left Ear: External ear normal.  Nose: Nose normal.  Eyes: Right eye exhibits no discharge. Left eye exhibits no discharge.  Cardiovascular: Normal rate, regular rhythm and normal heart sounds.   HR right around 100  Pulmonary/Chest: Effort normal  and breath sounds normal.  Abdominal: Soft. There is tenderness in the right upper quadrant. There is CVA tenderness (bilateral, right mildly worse than left).  Neurological: She is alert and oriented to person, place, and time.  Skin: Skin is warm and dry.    ED Course  Procedures (including critical care time) Labs Review Labs Reviewed  COMPREHENSIVE METABOLIC PANEL - Abnormal; Notable for the following:    Glucose, Bld 149 (*)    All other components within normal limits  URINALYSIS, ROUTINE W REFLEX  MICROSCOPIC  CBC WITH DIFFERENTIAL  LIPASE, BLOOD  POC URINE PREG, ED    Imaging Review No results found.   EKG Interpretation None      MDM   Final diagnoses:  Abdominal pain, unspecified abdominal location  Dysuria    Patient's abdominal pain is mostly right upper quadrant. She does have some mild CVA tenderness as well. She has no obvious cause of her dysuria she has no UTI. The resected for STD causing her symptoms or any vaginal symptoms. Given that her pain is more right upper quadrant is recommending an abdominal ultrasound to rule out a gallbladder cause. However patient decided to leave as she primarily was looking for antibiotics for a recurrent UTI and she feels her gallbladder is not the issue. She has minimal pain here and declines pain and nausea medicines. I had a discussion with patient and mom at bedside and recommended ultrasound but they prefer discharge now. She has no fever, elevated WBC, abnormal LFTs or significant pain/tenderness to suggest this is cholecystitis or needs emergent surgery. Given this, will d/c and recommend f/u with PCP or to return here if symptoms continue or worsen.    Audree CamelScott T Pearl Bents, MD 03/21/14 [phone]

## 2014-03-21 NOTE — Discharge Instructions (Signed)
Abdominal Pain, Women °Abdominal (stomach, pelvic, or belly) pain can be caused by many things. It is important to tell your doctor: °· The location of the pain. °· Does it come and go or is it present all the time? °· Are there things that start the pain (eating certain foods, exercise)? °· Are there other symptoms associated with the pain (fever, nausea, vomiting, diarrhea)? °All of this is helpful to know when trying to find the cause of the pain. °CAUSES  °· Stomach: virus or bacteria infection, or ulcer. °· Intestine: appendicitis (inflamed appendix), regional ileitis (Crohn's disease), ulcerative colitis (inflamed colon), irritable bowel syndrome, diverticulitis (inflamed diverticulum of the colon), or cancer of the stomach or intestine. °· Gallbladder disease or stones in the gallbladder. °· Kidney disease, kidney stones, or infection. °· Pancreas infection or cancer. °· Fibromyalgia (pain disorder). °· Diseases of the female organs: °¨ Uterus: fibroid (non-cancerous) tumors or infection. °¨ Fallopian tubes: infection or tubal pregnancy. °¨ Ovary: cysts or tumors. °¨ Pelvic adhesions (scar tissue). °¨ Endometriosis (uterus lining tissue growing in the pelvis and on the pelvic organs). °¨ Pelvic congestion syndrome (female organs filling up with blood just before the menstrual period). °¨ Pain with the menstrual period. °¨ Pain with ovulation (producing an egg). °¨ Pain with an IUD (intrauterine device, birth control) in the uterus. °¨ Cancer of the female organs. °· Functional pain (pain not caused by a disease, may improve without treatment). °· Psychological pain. °· Depression. °DIAGNOSIS  °Your doctor will decide the seriousness of your pain by doing an examination. °· Blood tests. °· X-rays. °· Ultrasound. °· CT scan (computed tomography, special type of X-ray). °· MRI (magnetic resonance imaging). °· Cultures, for infection. °· Barium enema (dye inserted in the large intestine, to better view it with  X-rays). °· Colonoscopy (looking in intestine with a lighted tube). °· Laparoscopy (minor surgery, looking in abdomen with a lighted tube). °· Major abdominal exploratory surgery (looking in abdomen with a large incision). °TREATMENT  °The treatment will depend on the cause of the pain.  °· Many cases can be observed and treated at home. °· Over-the-counter medicines recommended by your caregiver. °· Prescription medicine. °· Antibiotics, for infection. °· Birth control pills, for painful periods or for ovulation pain. °· Hormone treatment, for endometriosis. °· Nerve blocking injections. °· Physical therapy. °· Antidepressants. °· Counseling with a psychologist or psychiatrist. °· Minor or major surgery. °HOME CARE INSTRUCTIONS  °· Do not take laxatives, unless directed by your caregiver. °· Take over-the-counter pain medicine only if ordered by your caregiver. Do not take aspirin because it can cause an upset stomach or bleeding. °· Try a clear liquid diet (broth or water) as ordered by your caregiver. Slowly move to a bland diet, as tolerated, if the pain is related to the stomach or intestine. °· Have a thermometer and take your temperature several times a day, and record it. °· Bed rest and sleep, if it helps the pain. °· Avoid sexual intercourse, if it causes pain. °· Avoid stressful situations. °· Keep your follow-up appointments and tests, as your caregiver orders. °· If the pain does not go away with medicine or surgery, you may try: °¨ Acupuncture. °¨ Relaxation exercises (yoga, meditation). °¨ Group therapy. °¨ Counseling. °SEEK MEDICAL CARE IF:  °· You notice certain foods cause stomach pain. °· Your home care treatment is not helping your pain. °· You need stronger pain medicine. °· You want your IUD removed. °· You feel faint or   lightheaded.  You develop nausea and vomiting.  You develop a rash.  You are having side effects or an allergy to your medicine. SEEK IMMEDIATE MEDICAL CARE IF:   Your  pain does not go away or gets worse.  You have a fever.  Your pain is felt only in portions of the abdomen. The right side could possibly be appendicitis. The left lower portion of the abdomen could be colitis or diverticulitis.  You are passing blood in your stools (bright red or black tarry stools, with or without vomiting).  You have blood in your urine.  You develop chills, with or without a fever.  You pass out. MAKE SURE YOU:   Understand these instructions.  Will watch your condition.  Will get help right away if you are not doing well or get worse. Document Released: 06/21/2007 Document Revised: 11/16/2011 Document Reviewed: 07/11/2009 [facility] Patient Information 2015 [place], [place]. This information is not intended to replace advice given to you by your health care provider. Make sure you discuss any questions you have with your health care provider.     Dysuria Dysuria is the medical term for pain with urination. There are many causes for dysuria, but urinary tract infection is the most common. If a urinalysis was performed it can show that there is a urinary tract infection. A urine culture confirms that you or your child is sick. You will need to follow up with a healthcare provider because:  If a urine culture was done you will need to know the culture results and treatment recommendations.  If the urine culture was positive, you or your child will need to be put on antibiotics or know if the antibiotics prescribed are the right antibiotics for your urinary tract infection.  If the urine culture is negative (no urinary tract infection), then other causes may need to be explored or antibiotics need to be stopped. Today laboratory work may have been done and there does not seem to be an infection. If cultures were done they will take at least 24 to 48 hours to be completed. Today x-rays may have been taken and they read as normal. No cause can be found for the  problems. The x-rays may be re-read by a radiologist and you will be contacted if additional findings are made. You or your child may have been put on medications to help with this problem until you can see your primary caregiver. If the problems get better, see your primary caregiver if the problems return. If you were given antibiotics (medications which kill germs), take all of the mediations as directed for the full course of treatment.  If laboratory work was done, you need to find the results. Leave a telephone number where you can be reached. If this is not possible, make sure you find out how you are to get test results. HOME CARE INSTRUCTIONS   Drink lots of fluids. For adults, drink eight, 8 ounce glasses of clear juice or water a day. For children, replace fluids as suggested by your caregiver.  Empty the bladder often. Avoid holding urine for long periods of time.  After a bowel movement, women should cleanse front to back, using each tissue only once.  Empty your bladder before and after sexual intercourse.  Take all the medicine given to you until it is gone. You may feel better in a few days, but TAKE ALL MEDICINE.  Avoid caffeine, tea, alcohol and carbonated beverages, because they tend to irritate the  bladder.  In men, alcohol may irritate the prostate.  Only take over-the-counter or prescription medicines for pain, discomfort, or fever as directed by your caregiver.  If your caregiver has given you a follow-up appointment, it is very important to keep that appointment. Not keeping the appointment could result in a chronic or permanent injury, pain, and disability. If there is any problem keeping the appointment, you must call back to this facility for assistance. SEEK IMMEDIATE MEDICAL CARE IF:   Back pain develops.  A fever develops.  There is nausea (feeling sick to your stomach) or vomiting (throwing up).  Problems are no better with medications or are getting  worse. MAKE SURE YOU:   Understand these instructions.  Will watch your condition.  Will get help right away if you are not doing well or get worse. Document Released: 05/22/2004 Document Revised: 11/16/2011 Document Reviewed: 03/29/2008 [facility] Patient Information 2015 [place], [place]. This information is not intended to replace advice given to you by your health care provider. Make sure you discuss any questions you have with your health care provider.

## 2014-10-07 ENCOUNTER — Encounter (HOSPITAL_COMMUNITY): Payer: Self-pay | Admitting: Emergency Medicine

## 2014-10-07 ENCOUNTER — Emergency Department (HOSPITAL_COMMUNITY)
Admission: EM | Admit: 2014-10-07 | Discharge: 2014-10-07 | Disposition: A | Discharge status: Home / Self Care | Payer: 59 | Attending: Emergency Medicine | Admitting: Emergency Medicine

## 2014-10-07 DIAGNOSIS — E10649 Type 1 diabetes mellitus with hypoglycemia without coma: Secondary | ICD-10-CM | POA: Insufficient documentation

## 2014-10-07 DIAGNOSIS — Z8659 Personal history of other mental and behavioral disorders: Secondary | ICD-10-CM | POA: Diagnosis not present

## 2014-10-07 DIAGNOSIS — I10 Essential (primary) hypertension: Secondary | ICD-10-CM | POA: Diagnosis not present

## 2014-10-07 DIAGNOSIS — Z794 Long term (current) use of insulin: Secondary | ICD-10-CM | POA: Diagnosis not present

## 2014-10-07 DIAGNOSIS — E162 Hypoglycemia, unspecified: Secondary | ICD-10-CM

## 2014-10-07 LAB — CBG MONITORING, ED
GLUCOSE-CAPILLARY: 178 mg/dL — AB (ref 70–99)
Glucose-Capillary: 202 mg/dL — ABNORMAL HIGH (ref 70–99)

## 2014-10-07 NOTE — Discharge Instructions (Signed)
Please call your prescribing physician about your hypoglycemia event today.  Hypoglycemia Hypoglycemia occurs when the glucose in your blood is too low. Glucose is a type of sugar that is your body's main energy source. Hormones, such as insulin and glucagon, control the level of glucose in the blood. Insulin lowers blood glucose and glucagon increases blood glucose. Having too much insulin in your blood stream, or not eating enough food containing sugar, can result in hypoglycemia. Hypoglycemia can happen to people with or without diabetes. It can develop quickly and can be a medical emergency.  CAUSES   Missing or delaying meals.  Not eating enough carbohydrates at meals.  Taking too much diabetes medicine.  Not timing your oral diabetes medicine or insulin doses with meals, snacks, and exercise.  Nausea and vomiting.  Certain medicines.  Severe illnesses, such as hepatitis, kidney disorders, and certain eating disorders.  Increased activity or exercise without eating something extra or adjusting medicines.  Drinking too much alcohol.  A nerve disorder that affects body functions like your heart rate, blood pressure, and digestion (autonomic neuropathy).  A condition where the stomach muscles do not function properly (gastroparesis). Therefore, medicines and food may not absorb properly.  Rarely, a tumor of the pancreas can produce too much insulin. SYMPTOMS   Hunger.  Sweating (diaphoresis).  Change in body temperature.  Shakiness.  Headache.  Anxiety.  Lightheadedness.  Irritability.  Difficulty concentrating.  Dry mouth.  Tingling or numbness in the hands or feet.  Restless sleep or sleep disturbances.  Altered speech and coordination.  Change in mental status.  Seizures or prolonged convulsions.  Combativeness.  Drowsiness (lethargic).  Weakness.  Increased heart rate or palpitations.  Confusion.  Pale, gray skin color.  Blurred or double  vision.  Fainting. DIAGNOSIS  A physical exam and medical history will be performed. Your caregiver may make a diagnosis based on your symptoms. Blood tests and other lab tests may be performed to confirm a diagnosis. Once the diagnosis is made, your caregiver will see if your signs and symptoms go away once your blood glucose is raised.  TREATMENT  Usually, you can easily treat your hypoglycemia when you notice symptoms.  Check your blood glucose. If it is less than 70 mg/dl, take one of the following:   3-4 glucose tablets.    cup juice.    cup regular soda.   1 cup skim milk.   -1 tube of glucose gel.   5-6 hard candies.   Avoid high-fat drinks or food that may delay a rise in blood glucose levels.  Do not take more than the recommended amount of sugary foods, drinks, gel, or tablets. Doing so will cause your blood glucose to go too high.   Wait 10-15 minutes and recheck your blood glucose. If it is still less than 70 mg/dl or below your target range, repeat treatment.   Eat a snack if it is more than 1 hour until your next meal.  There may be a time when your blood glucose may go so low that you are unable to treat yourself at home when you start to notice symptoms. You may need someone to help you. You may even faint or be unable to swallow. If you cannot treat yourself, someone will need to bring you to the hospital.  HOME CARE INSTRUCTIONS  If you have diabetes, follow your diabetes management plan by:  Taking your medicines as directed.  Following your exercise plan.  Following your meal plan.  Do not skip meals. Eat on time.  Testing your blood glucose regularly. Check your blood glucose before and after exercise. If you exercise longer or different than usual, be sure to check blood glucose more frequently.  Wearing your medical alert jewelry that says you have diabetes.  Identify the cause of your hypoglycemia. Then, develop ways to prevent the  recurrence of hypoglycemia.  Do not take a hot bath or shower right after an insulin shot.  Always carry treatment with you. Glucose tablets are the easiest to carry.  If you are going to drink alcohol, drink it only with meals.  Tell friends or family members ways to keep you safe during a seizure. This may include removing hard or sharp objects from the area or turning you on your side.  Maintain a healthy weight. SEEK MEDICAL CARE IF:   You are having problems keeping your blood glucose in your target range.  You are having frequent episodes of hypoglycemia.  You feel you might be having side effects from your medicines.  You are not sure why your blood glucose is dropping so low.  You notice a change in vision or a new problem with your vision. SEEK IMMEDIATE MEDICAL CARE IF:   Confusion develops.  A change in mental status occurs.  The inability to swallow develops.  Fainting occurs. Document Released: 08/24/2005 Document Revised: 08/29/2013 Document Reviewed: 12/21/2011 [facility] Patient Information 2015 [place], [place]. This information is not intended to replace advice given to you by your health care provider. Make sure you discuss any questions you have with your health care provider.

## 2014-10-07 NOTE — ED Notes (Signed)
Bed: [id number] Expected date: 10/07/14 Expected time: 11:30 AM Means of arrival: Ambulance Comments: Hypoglycemia

## 2014-10-07 NOTE — ED Notes (Signed)
Pt from home via EMS c/o hypoglycemia-Per EMS pt woke up this am and took 3 glucose tablets. Parents called 911 because pt was weak, slumped over and difficulty speaking and walking. Pt CBG on scene was 64. Pt received 1 tube oral glucose, peanut butter sandwich and koolaid and CBG was still 62. Pt started acting more normal en route and CBG was 94 at that time. Pt also received 200 NS bolus en route. Pt took Novolog at bedtime with meal. Pt A&O and in NAD

## 2014-10-07 NOTE — ED Provider Notes (Signed)
CSN: [id number]     Arrival date & time 10/07/14  1134 History   First MD Initiated Contact with Patient 10/07/14 1315     Chief Complaint  Patient presents with  . Hypoglycemia     (Consider location/radiation/quality/duration/timing/severity/associated sxs/prior Treatment) HPI  This is a 24 year old female who presents emergency Department with chief complaint of hypoglycemia. She is an insulin-dependent diabetic. The patient states that she took her usual dose of Lantus last night. She states that she normally gets up around 5 AM to go to work. However, she decided to sleep in today. Around 10 AM she awoke with difficulty in moving and speech, cold and sweaty. Patient states she felt as if her blood sugar was very low and took 3 glucose tablets. Initial blood sugar at 69. Denies fevers, chills, myalgias, arthralgias. Denies DOE, SOB, chest tightness or pressure, radiation to left arm, jaw or back, or diaphoresis. Denies dysuria, flank pain, suprapubic pain, frequency, urgency, or hematuria. Denies headaches, light headedness, weakness, visual disturbances. Denies abdominal pain, nausea, vomiting, diarrhea or constipation.    Past Medical History  Diagnosis Date  . Diabetes mellitus     Type 1  . Hypertension age 24  . Panic attacks   . Anxiety    Past Surgical History  Procedure Laterality Date  . No past surgeries    . Cystoscopy     Family History  Problem Relation Age of Onset  . Diabetes Father   . Diabetes Maternal Grandmother   . Breast cancer Maternal Aunt   . Heart attack Maternal Grandfather    History  Substance Use Topics  . Smoking status: Never Smoker   . Smokeless tobacco: Never Used  . Alcohol Use: No   OB History    Gravida Para Term Preterm AB TAB SAB Ectopic Multiple Living   0              Review of Systems Ten systems reviewed and are negative for acute change, except as noted in the HPI.     Allergies  Review of patient's allergies indicates  no known allergies.  Home Medications   Prior to Admission medications   Medication Sig Start Date End Date Taking? Authorizing Provider  ibuprofen (ADVIL,MOTRIN) 200 MG tablet Take 400 mg by mouth every 6 (six) hours as needed for fever, headache, moderate pain or cramping.   Yes Historical Provider, MD  insulin aspart (NOVOLOG) 100 UNIT/ML injection Inject 2-6 Units into the skin 3 (three) times daily before meals. Sliding Scale : 1 unit per 15 gm carbs, 1 unit for every 40/150 blood sugar   Yes Historical Provider, MD  insulin detemir (LEVEMIR) 100 UNIT/ML injection Inject 11 Units into the skin at bedtime.    Yes Historical Provider, MD  metoCLOPramide (REGLAN) 10 MG tablet Take 1 tablet (10 mg total) by mouth every 6 (six) hours. Take every 6 hours as needed for nausea. Patient not taking: Reported on 10/07/2014 03/06/13   Santiago GladHeather Laisure, PA-C   BP 107/71 mmHg  Pulse 103  Resp 16  SpO2 99%  LMP 09/23/2014 Physical Exam  Constitutional: She is oriented to person, place, and time. She appears well-developed and well-nourished. No distress.  HENT:  Head: Normocephalic and atraumatic.  Eyes: Conjunctivae are normal. No scleral icterus.  Neck: Normal range of motion.  Cardiovascular: Normal rate, regular rhythm and normal heart sounds.  Exam reveals no gallop and no friction rub.   No murmur heard. Pulmonary/Chest: Effort normal and breath sounds  normal. No respiratory distress.  Abdominal: Soft. Bowel sounds are normal. She exhibits no distension and no mass. There is no tenderness. There is no guarding.  Neurological: She is alert and oriented to person, place, and time.  Skin: Skin is warm and dry. She is not diaphoretic.  Nursing note and vitals reviewed.   ED Course  Procedures (including critical care time) Labs Review Labs Reviewed  CBG MONITORING, ED - Abnormal; Notable for the following:    Glucose-Capillary 178 (*)    All other components within normal limits  CBG  MONITORING, ED - Abnormal; Notable for the following:    Glucose-Capillary 202 (*)    All other components within normal limits    Imaging Review No results found.   EKG Interpretation None      MDM   Final diagnoses:  Hypoglycemia   BP 114/67 mmHg  Pulse 99  Temp(Src) 99.2 F (37.3 C) (Oral)  Resp 16  SpO2 100%  LMP 09/23/2014 Patient here after missing her regular morning meal and with hypoglycemia this morning. Her blood sugar has trended upwards and she's been here. I discussed the case with Dr. Estell Harpin , who feels she is safe for discharge at this time. I have encouraged patient to speak with her prescribing physician to discuss her hypoglycemic event today.  The patient appears reasonably screened and/or stabilized for discharge and I doubt any other medical condition or other [facility] requiring further screening, evaluation, or treatment in the ED at this time prior to discharge.   Medical screening examination/treatment/procedure(s) were performed by non-physician practitioner and as supervising physician I was immediately available for consultation/collaboration.   EKG Interpretation None         Arthor Captain, PA-C 10/07/14 1625  Benny Lennert, MD 10/23/14 [phone]

## 2014-10-26 ENCOUNTER — Encounter: Payer: Self-pay | Admitting: Endocrinology

## 2014-10-26 ENCOUNTER — Ambulatory Visit (INDEPENDENT_AMBULATORY_CARE_PROVIDER_SITE_OTHER): Payer: 59 | Admitting: Endocrinology

## 2014-10-26 VITALS — BP 124/84 | HR 106 | Temp 98.3°F | Ht 65.0 in | Wt 125.0 lb

## 2014-10-26 DIAGNOSIS — E1043 Type 1 diabetes mellitus with diabetic autonomic (poly)neuropathy: Secondary | ICD-10-CM

## 2014-10-26 MED ORDER — GLUCOSE BLOOD VI STRP: 1.0000 | ORAL_STRIP | Freq: Four times a day (QID) | Status: DC

## 2014-10-26 MED ORDER — INSULIN DETEMIR 100 UNIT/ML ~~LOC~~ SOLN: 9.0000 [IU] | Freq: Every day | SUBCUTANEOUS | Status: DC

## 2014-10-26 MED ORDER — INSULIN ASPART 100 UNIT/ML ~~LOC~~ SOLN: 2.0000 [IU] | Freq: Three times a day (TID) | SUBCUTANEOUS | Status: DC

## 2014-10-26 NOTE — Progress Notes (Signed)
Subjective:    Patient ID: Chelsea Sparks, female    DOB: October 25, 1990, 24 y.o.   MRN: [id number]  HPI Pt returns for f/u of diabetes mellitus: DM type: 1 Dx'ed: 2002 Complications: polyneuropathy and gastroparesis. Therapy: insulin since dx DKA: most recently was 2011 Severe hypoglycemia: most recent was [place] of 2015 Pancreatitis: never. Other: pt declines pump rx. Interval history: pt says she is having frequent hypoglycemia in the early hrs of the am.  She seldom checks at other times of day.  She averages approx 10-12 units of novolog total per day.  She recently regained her insurance.   Past Medical History  Diagnosis Date  . Diabetes mellitus     Type 1  . Hypertension age 50  . Panic attacks   . Anxiety     Past Surgical History  Procedure Laterality Date  . No past surgeries    . Cystoscopy      History   Social History  . Marital Status: Single    Spouse Name: N/A  . Number of Children: 0  . Years of Education: N/A   Occupational History  .     Social History Main Topics  . Smoking status: Never Smoker   . Smokeless tobacco: Never Used  . Alcohol Use: No  . Drug Use: No  . Sexual Activity: No   Other Topics Concern  . Not on file   Social History Narrative    Current Outpatient Prescriptions on File Prior to Visit  Medication Sig Dispense Refill  . ibuprofen (ADVIL,MOTRIN) 200 MG tablet Take 400 mg by mouth every 6 (six) hours as needed for fever, headache, moderate pain or cramping.    . metoCLOPramide (REGLAN) 10 MG tablet Take 1 tablet (10 mg total) by mouth every 6 (six) hours. Take every 6 hours as needed for nausea. 30 tablet 0   No current facility-administered medications on file prior to visit.    No Known Allergies  Family History  Problem Relation Age of Onset  . Diabetes Father   . Diabetes Maternal Grandmother   . Breast cancer Maternal Aunt   . Heart attack Maternal Grandfather     BP 124/84 mmHg  Pulse 106  Temp(Src)  98.3 F (36.8 C) (Oral)  Ht  (1.651 m)  Wt 125 lb (56.7 kg)  BMI 20.80 kg/m2  SpO2 96%  LMP 09/23/2014  Review of Systems She has gained a few lbs.  N/v is much less recently.      Objective:   Physical Exam VITAL SIGNS:  See vs page GENERAL: no distress Pulses: dorsalis pedis intact bilat.   MSK: no deformity of the feet CV: no leg edema Skin:  no ulcer on the feet.  normal color and temp on the feet. Neuro: sensation is intact to touch on the feet.        Assessment & Plan:  DM: uncertain control Side-effect of rx: hypoglycemia, severe.    Patient is advised the following: Patient Instructions  good diet and exercise habits significanly improve the control of your diabetes.  please let me know if you wish to be referred to a dietician.  high blood sugar is very risky to your health.  you should see an eye doctor and dentist every year.  It is very important to get all recommended vaccinations.  check your blood sugar 4 times a day: before the 3 meals, and at bedtime.  also check if you have symptoms of your blood  sugar being too high or too low.  please keep a record of the readings and bring it to your next appointment here.  You can write it on any piece of paper.  please call us sooner if your blood sugar goes below 70, or if you have a lot of readings over 200.  blood tests are being requested for you today.  We'll let you know about the results.  Please reduce the levemir to 9 units at bedtime.   In view of your medical condition, you should avoid pregnancy until we have decided it is safe. Please come back for a follow-up appointment in 2 months.

## 2014-10-26 NOTE — Patient Instructions (Addendum)
good diet and exercise habits significanly improve the control of your diabetes.  please let me know if you wish to be referred to a dietician.  high blood sugar is very risky to your health.  you should see an eye doctor and dentist every year.  It is very important to get all recommended vaccinations.  check your blood sugar 4 times a day: before the 3 meals, and at bedtime.  also check if you have symptoms of your blood sugar being too high or too low.  please keep a record of the readings and bring it to your next appointment here.  You can write it on any piece of paper.  please call us sooner if your blood sugar goes below 70, or if you have a lot of readings over 200.  blood tests are being requested for you today.  We'll let you know about the results.  Please reduce the levemir to 9 units at bedtime.   In view of your medical condition, you should avoid pregnancy until we have decided it is safe. Please come back for a follow-up appointment in 2 months.

## 2014-11-02 ENCOUNTER — Other Ambulatory Visit (INDEPENDENT_AMBULATORY_CARE_PROVIDER_SITE_OTHER): Payer: 59

## 2014-11-02 DIAGNOSIS — E1043 Type 1 diabetes mellitus with diabetic autonomic (poly)neuropathy: Secondary | ICD-10-CM | POA: Diagnosis not present

## 2014-11-02 LAB — BASIC METABOLIC PANEL
BUN: 16 mg/dL (ref 6–23)
CO2: 31 meq/L (ref 19–32)
Calcium: 9.6 mg/dL (ref 8.4–10.5)
Chloride: 103 mEq/L (ref 96–112)
Creatinine, Ser: 0.69 mg/dL (ref 0.40–1.20)
GFR: 110.22 mL/min (ref >60.00)
Glucose, Bld: 65 mg/dL — ABNORMAL LOW (ref 70–99)
Potassium: 3.8 mEq/L (ref 3.5–5.1)
SODIUM: 138 meq/L (ref 135–145)

## 2014-11-02 LAB — TSH: TSH: 0.93 u[IU]/mL (ref 0.35–4.50)

## 2014-11-02 LAB — LIPID PANEL
Cholesterol: 170 mg/dL (ref 0–200)
HDL: 60.6 mg/dL (ref >39.00)
LDL CALC: 102 mg/dL — AB (ref 0–99)
NonHDL: 109.4
TRIGLYCERIDES: 36 mg/dL (ref 0.0–149.0)
Total CHOL/HDL Ratio: 3
VLDL: 7.2 mg/dL (ref 0.0–40.0)

## 2014-11-02 LAB — MICROALBUMIN / CREATININE URINE RATIO
Creatinine,U: 82.4 mg/dL
MICROALB UR: 1.6 mg/dL (ref 0.0–1.9)
Microalb Creat Ratio: 1.9 mg/g (ref 0.0–30.0)

## 2014-11-02 LAB — HEMOGLOBIN A1C: HEMOGLOBIN A1C: 9.1 % — AB (ref 4.6–6.5)

## 2014-11-09 ENCOUNTER — Telehealth: Payer: Self-pay

## 2014-11-09 MED ORDER — INSULIN LISPRO 100 UNIT/ML ~~LOC~~ SOLN: SUBCUTANEOUS | Status: DC

## 2014-11-09 NOTE — Telephone Encounter (Signed)
Received a request from pt's pharmacy stating Novolog is no longer covered by pt's insurance. Alternative is Humalog. Ok to change? Thanks!

## 2014-11-09 NOTE — Telephone Encounter (Signed)
ok 

## 2014-11-09 NOTE — Telephone Encounter (Signed)
Rx sent to pharmacy   

## 2014-11-21 ENCOUNTER — Encounter: Payer: Self-pay | Admitting: Family

## 2014-11-21 ENCOUNTER — Ambulatory Visit (INDEPENDENT_AMBULATORY_CARE_PROVIDER_SITE_OTHER): Payer: 59 | Admitting: Family

## 2014-11-21 VITALS — BP 120/82 | HR 97 | Temp 98.4°F | Resp 18 | Ht 65.0 in | Wt 123.1 lb

## 2014-11-21 DIAGNOSIS — E1343 Other specified diabetes mellitus with diabetic autonomic (poly)neuropathy: Secondary | ICD-10-CM

## 2014-11-21 MED ORDER — ONDANSETRON HCL 4 MG PO TABS: 4.0000 mg | ORAL_TABLET | Freq: Three times a day (TID) | ORAL | Status: DC | PRN

## 2014-11-21 NOTE — Progress Notes (Signed)
Pre visit review using our clinic review tool, if applicable. No additional management support is needed unless otherwise documented below in the visit note. 

## 2014-11-21 NOTE — Assessment & Plan Note (Signed)
Symptoms described consistent with flair of the gastroparesis that appear to be resolving with metoclopramide that was previously prescribed. Continue metoclopramide at current dosage as previously prescribed. Start ondansetron as needed for nausea. Follow up if symptoms worsen or fail to improve.

## 2014-11-21 NOTE — Progress Notes (Signed)
   Subjective:    Patient ID: Chelsea Sparks, female    DOB: 03/13/1991, 24 y.o.   MRN: [id number]  Chief Complaint  Patient presents with  . Follow-up    had an episode of gastroparalysis on monday and in order for her to go back to work she had to be seen and get a doctors not    HPI:  Chelsea Gibneymanda C Verbeek is a 24 y.o. female with a history of Type I Diabetes, Neurpoathy, anxiety and gastroparesis who presents today for follow up.    This is an exacerbation of a known problem. Associated symptoms of nausea and vomiting occurred on Monday as a flair up of her gastroparesis. Notes that curtting out the corn in her diet has made a significant difference and believes she may have had some corn related product on Monday. She treated it with the previously prescribed reglan which has helped to resolved her symptoms. Continues to experience some nausea, but is improving.    No Known Allergies   Current Outpatient Prescriptions on File Prior to Visit  Medication Sig Dispense Refill  . glucose blood (ONE TOUCH ULTRA TEST) test strip 1 each by Other route 4 (four) times daily. And lancets 4/day 120 each 12  . ibuprofen (ADVIL,MOTRIN) 200 MG tablet Take 400 mg by mouth every 6 (six) hours as needed for fever, headache, moderate pain or cramping.    . insulin detemir (LEVEMIR) 100 UNIT/ML injection Inject 0.09 mLs (9 Units total) into the skin at bedtime. 10 mL 11  . insulin lispro (HUMALOG) 100 UNIT/ML injection Use 2-6 units 3 times per day per sliding scale. 10 mL 11  . metoCLOPramide (REGLAN) 10 MG tablet Take 1 tablet (10 mg total) by mouth every 6 (six) hours. Take every 6 hours as needed for nausea. 30 tablet 0   No current facility-administered medications on file prior to visit.    Review of Systems  Constitutional: Negative for fever and chills.  Gastrointestinal: Positive for nausea. Negative for abdominal pain and abdominal distention.      Objective:    BP 120/82 mmHg  Pulse 97   Temp(Src) 98.4 F (36.9 C) (Oral)  Resp 18  Ht 5\' 5"  (1.651 m)  Wt 123 lb 1.9 oz (55.847 kg)  BMI 20.49 kg/m2  SpO2 98% Nursing note and vital signs reviewed.  Physical Exam  Constitutional: She is oriented to person, place, and time. She appears well-developed and well-nourished. No distress.  Cardiovascular: Normal rate, regular rhythm, normal heart sounds and intact distal pulses.   Pulmonary/Chest: Effort normal and breath sounds normal.  Abdominal: Normal appearance. She exhibits no mass. Bowel sounds are decreased. There is no hepatosplenomegaly. There is no tenderness. There is no rigidity, no rebound, no guarding, no tenderness at McBurney's point and negative Murphy's sign.  Neurological: She is alert and oriented to person, place, and time.  Skin: Skin is warm and dry.  Psychiatric: She has a normal mood and affect. Her behavior is normal. Judgment and thought content normal.       Assessment & Plan:

## 2014-11-21 NOTE — Patient Instructions (Signed)
Thank you for choosing [facility].  Summary/Instructions:  Your prescription(s) have been submitted to your pharmacy or been printed and provided for you. Please take as directed and contact our office if you believe you are having problem(s) with the medication(s) or have any questions.  If your symptoms worsen or fail to improve, please contact our office for further instruction, or in case of emergency go directly to the emergency room at the closest medical facility.     

## 2014-12-26 ENCOUNTER — Ambulatory Visit: Payer: No Typology Code available for payment source | Admitting: Endocrinology

## 2015-03-01 ENCOUNTER — Telehealth: Payer: Self-pay

## 2015-03-01 NOTE — Telephone Encounter (Signed)
Left Vm for pt to return call about appt for HgA1C recheck 

## 2015-05-30 ENCOUNTER — Encounter (HOSPITAL_COMMUNITY): Payer: Self-pay | Admitting: *Deleted

## 2015-05-30 ENCOUNTER — Emergency Department (HOSPITAL_COMMUNITY)
Admission: EM | Admit: 2015-05-30 | Discharge: 2015-05-30 | Disposition: A | Discharge status: Home / Self Care | Payer: 59 | Attending: Emergency Medicine | Admitting: Emergency Medicine

## 2015-05-30 DIAGNOSIS — I10 Essential (primary) hypertension: Secondary | ICD-10-CM | POA: Insufficient documentation

## 2015-05-30 DIAGNOSIS — Z794 Long term (current) use of insulin: Secondary | ICD-10-CM | POA: Insufficient documentation

## 2015-05-30 DIAGNOSIS — Z8659 Personal history of other mental and behavioral disorders: Secondary | ICD-10-CM | POA: Insufficient documentation

## 2015-05-30 DIAGNOSIS — E10649 Type 1 diabetes mellitus with hypoglycemia without coma: Secondary | ICD-10-CM | POA: Insufficient documentation

## 2015-05-30 LAB — CBG MONITORING, ED: GLUCOSE-CAPILLARY: 170 mg/dL — AB (ref 65–99)

## 2015-05-30 NOTE — ED Notes (Addendum)
Patient comes from home with hypoglycemia. Patient's blood sugar was 48 then 37 and 198 after receiving d50. Patient has history of diabetes but only began to have problems with maintaining blood sugars recently since a change in work schedule. Patient is currently alert and oriented. Patient states that she did not get up to eat today and that she is sure that is why her blood sugar dropped.

## 2015-05-30 NOTE — Discharge Instructions (Signed)
Blood Glucose Monitoring °Monitoring your blood glucose (also know as blood sugar) helps you to manage your diabetes. It also helps you and your health care provider monitor your diabetes and determine how well your treatment plan is working. °WHY SHOULD YOU MONITOR YOUR BLOOD GLUCOSE? °· It can help you understand how food, exercise, and medicine affect your blood glucose. °· It allows you to know what your blood glucose is at any given moment. You can quickly tell if you are having low blood glucose (hypoglycemia) or high blood glucose (hyperglycemia). °· It can help you and your health care provider know how to adjust your medicines. °· It can help you understand how to manage an illness or adjust medicine for exercise. °WHEN SHOULD YOU TEST? °Your health care provider will help you decide how often you should check your blood glucose. This may depend on the type of diabetes you have, your diabetes control, or the types of medicines you are taking. Be sure to write down all of your blood glucose readings so that this information can be reviewed with your health care provider. See below for examples of testing times that your health care provider may suggest. °Type 1 Diabetes °· Test 4 times a day if you are in good control, using an insulin pump, or perform multiple daily injections. °· If your diabetes is not well controlled or if you are sick, you may need to monitor more often. °· It is a good idea to also monitor: °· Before and after exercise. °· Between meals and 2 hours after a meal. °· Occasionally between 2:00 a.m. and 3:00 a.m. °Type 2 Diabetes °· It can vary with each person, but generally, if you are on insulin, test 4 times a day. °· If you take medicines by mouth (orally), test 2 times a day. °· If you are on a controlled diet, test once a day. °· If your diabetes is not well controlled or if you are sick, you may need to monitor more often. °HOW TO MONITOR YOUR BLOOD GLUCOSE °Supplies  Needed °· Blood glucose meter. °· Test strips for your meter. Each meter has its own strips. You must use the strips that go with your own meter. °· A pricking needle (lancet). °· A device that holds the lancet (lancing device). °· A journal or log book to write down your results. °Procedure °· Wash your hands with soap and water. Alcohol is not preferred. °· Prick the side of your finger (not the tip) with the lancet. °· Gently milk the finger until a small drop of blood appears. °· Follow the instructions that come with your meter for inserting the test strip, applying blood to the strip, and using your blood glucose meter. °Other Areas to Get Blood for Testing °Some meters allow you to use other areas of your body (other than your finger) to test your blood. These areas are called alternative sites. The most common alternative sites are: °· The forearm. °· The thigh. °· The back area of the lower leg. °· The palm of the hand. °The blood flow in these areas is slower. Therefore, the blood glucose values you get may be delayed, and the numbers are different from what you would get from your fingers. Do not use alternative sites if you think you are having hypoglycemia. Your reading will not be accurate. Always use a finger if you are having hypoglycemia. Also, if you cannot feel your lows (hypoglycemia unawareness), always use your fingers for your   blood glucose checks. ADDITIONAL TIPS FOR GLUCOSE MONITORING  Do not reuse lancets.  Always carry your supplies with you.  All blood glucose meters have a 24-hour "hotline" number to call if you have questions or need help.  Adjust (calibrate) your blood glucose meter with a control solution after finishing a few boxes of strips. Hypoglycemia Hypoglycemia occurs when the glucose in your blood is too low. Glucose is a type of sugar that is your body's main energy source. Hormones, such as insulin and glucagon, control the level of glucose in the blood. Insulin  lowers blood glucose and glucagon increases blood glucose. Having too much insulin in your blood stream, or not eating enough food containing sugar, can result in hypoglycemia. Hypoglycemia can happen to people with or without diabetes. It can develop quickly and can be a medical emergency.  CAUSES   Missing or delaying meals.  Not eating enough carbohydrates at meals.  Taking too much diabetes medicine.  Not timing your oral diabetes medicine or insulin doses with meals, snacks, and exercise.  Nausea and vomiting.  Certain medicines.  Severe illnesses, such as hepatitis, kidney disorders, and certain eating disorders.  Increased activity or exercise without eating something extra or adjusting medicines.  Drinking too much alcohol.  A nerve disorder that affects body functions like your heart rate, blood pressure, and digestion (autonomic neuropathy).  A condition where the stomach muscles do not function properly (gastroparesis). Therefore, medicines and food may not absorb properly.  Rarely, a tumor of the pancreas can produce too much insulin. SYMPTOMS   Hunger.  Sweating (diaphoresis).  Change in body temperature.  Shakiness.  Headache.  Anxiety.  Lightheadedness.  Irritability.  Difficulty concentrating.  Dry mouth.  Tingling or numbness in the hands or feet.  Restless sleep or sleep disturbances.  Altered speech and coordination.  Change in mental status.  Seizures or prolonged convulsions.  Combativeness.  Drowsiness (lethargic).  Weakness.  Increased heart rate or palpitations.  Confusion.  Pale, gray skin color.  Blurred or double vision.  Fainting. DIAGNOSIS  A physical exam and medical history will be performed. Your caregiver may make a diagnosis based on your symptoms. Blood tests and other lab tests may be performed to confirm a diagnosis. Once the diagnosis is made, your caregiver will see if your signs and symptoms go away once  your blood glucose is raised.  TREATMENT  Usually, you can easily treat your hypoglycemia when you notice symptoms.  Check your blood glucose. If it is less than 70 mg/dl, take one of the following:   3-4 glucose tablets.    cup juice.    cup regular soda.   1 cup skim milk.   -1 tube of glucose gel.   5-6 hard candies.   Avoid high-fat drinks or food that may delay a rise in blood glucose levels.  Do not take more than the recommended amount of sugary foods, drinks, gel, or tablets. Doing so will cause your blood glucose to go too high.   Wait 10-15 minutes and recheck your blood glucose. If it is still less than 70 mg/dl or below your target range, repeat treatment.   Eat a snack if it is more than 1 hour until your next meal.  There may be a time when your blood glucose may go so low that you are unable to treat yourself at home when you start to notice symptoms. You may need someone to help you. You may even faint or be unable  to swallow. If you cannot treat yourself, someone will need to bring you to the hospital.  HOME CARE INSTRUCTIONS  If you have diabetes, follow your diabetes management plan by:  Taking your medicines as directed.  Following your exercise plan.  Following your meal plan. Do not skip meals. Eat on time.  Testing your blood glucose regularly. Check your blood glucose before and after exercise. If you exercise longer or different than usual, be sure to check blood glucose more frequently.  Wearing your medical alert jewelry that says you have diabetes.  Identify the cause of your hypoglycemia. Then, develop ways to prevent the recurrence of hypoglycemia.  Do not take a hot bath or shower right after an insulin shot.  Always carry treatment with you. Glucose tablets are the easiest to carry.  If you are going to drink alcohol, drink it only with meals.  Tell friends or family members ways to keep you safe during a seizure. This may  include removing hard or sharp objects from the area or turning you on your side.  Maintain a healthy weight. SEEK MEDICAL CARE IF:   You are having problems keeping your blood glucose in your target range.  You are having frequent episodes of hypoglycemia.  You feel you might be having side effects from your medicines.  You are not sure why your blood glucose is dropping so low.  You notice a change in vision or a new problem with your vision. SEEK IMMEDIATE MEDICAL CARE IF:   Confusion develops.  A change in mental status occurs.  The inability to swallow develops.  Fainting occurs. Document Released: 08/24/2005 Document Revised: 08/29/2013 Document Reviewed: 12/21/2011 [facility] Patient Information 2015 [place], [place]. This information is not intended to replace advice given to you by your health care provider. Make sure you discuss any questions you have with your health care provider.

## 2015-05-30 NOTE — ED Notes (Signed)
Bed: RESB Expected date:  Expected time:  Means of arrival:  Comments: EMS- 24yo F, hypoglycemia

## 2015-05-30 NOTE — ED Notes (Signed)
Bed: WA14 Expected date:  Expected time:  Means of arrival:  Comments: Hold for res b 

## 2015-05-30 NOTE — ED Provider Notes (Signed)
CSN: [id number]     Arrival date & time 05/30/15  1315 History   First MD Initiated Contact with Patient 05/30/15 1428     Chief Complaint  Patient presents with  . Hypoglycemia     (Consider location/radiation/quality/duration/timing/severity/associated sxs/prior Treatment) HPI Patient states that she overslept and did not eat in time to maintain her blood sugar. She reports that her blood sugar got low and it caused half of her body to go numb. She reports this is her blood sugar came back up everything worked normally again. She states this is happening her before with hypoglycemic episode. The last episode was several months ago. She states she feels back to normal now. She denies she's been sick. She denies any pain. Past Medical History  Diagnosis Date  . Diabetes mellitus     Type 1  . Hypertension age 24  . Panic attacks   . Anxiety    Past Surgical History  Procedure Laterality Date  . No past surgeries    . Cystoscopy     Family History  Problem Relation Age of Onset  . Diabetes Father   . Diabetes Maternal Grandmother   . Breast cancer Maternal Aunt   . Heart attack Maternal Grandfather    Social History  Substance Use Topics  . Smoking status: Never Smoker   . Smokeless tobacco: Never Used  . Alcohol Use: No   OB History    Gravida Para Term Preterm AB TAB SAB Ectopic Multiple Living   0              Review of Systems 10 Systems reviewed and are negative for acute change except as noted in the HPI.    Allergies  Review of patient's allergies indicates no known allergies.  Home Medications   Prior to Admission medications   Medication Sig Start Date End Date Taking? Authorizing Provider  ibuprofen (ADVIL,MOTRIN) 200 MG tablet Take 400 mg by mouth every 6 (six) hours as needed for fever, headache, moderate pain or cramping.   Yes Historical Provider, MD  insulin detemir (LEVEMIR) 100 UNIT/ML injection Inject 0.09 mLs (9 Units total) into the skin at  bedtime. Patient taking differently: Inject 11 Units into the skin at bedtime.  10/26/14  Yes Romero Belling, MD  insulin lispro (HUMALOG) 100 UNIT/ML injection Use 2-6 units 3 times per day per sliding scale. 11/09/14  Yes Romero Belling, MD  metoCLOPramide (REGLAN) 10 MG tablet Take 1 tablet (10 mg total) by mouth every 6 (six) hours. Take every 6 hours as needed for nausea. 03/06/13  Yes Heather Laisure, PA-C  ondansetron (ZOFRAN) 4 MG tablet Take 1 tablet (4 mg total) by mouth every 8 (eight) hours as needed for nausea or vomiting. 11/21/14  Yes Veryl Speak, FNP   BP 123/78 mmHg  Pulse 103  Temp(Src) 98.6 F (37 C) (Oral)  Resp 18  SpO2 100%  LMP 05/16/2015 Physical Exam  Constitutional: She is oriented to person, place, and time. She appears well-developed and well-nourished.  HENT:  Head: Normocephalic and atraumatic.  Eyes: EOM are normal. Pupils are equal, round, and reactive to light.  Neck: Neck supple.  Cardiovascular: Normal rate, regular rhythm, normal heart sounds and intact distal pulses.   Pulmonary/Chest: Effort normal and breath sounds normal.  Abdominal: Soft. Bowel sounds are normal. She exhibits no distension. There is no tenderness.  Musculoskeletal: Normal range of motion. She exhibits no edema.  Neurological: She is alert and oriented to person, place, and  time. She has normal strength. Coordination normal. GCS eye subscore is 4. GCS verbal subscore is 5. GCS motor subscore is 6.  Skin: Skin is warm, dry and intact.  Psychiatric: She has a normal mood and affect.    ED Course  Procedures (including critical care time) Labs Review Labs Reviewed  CBG MONITORING, ED - Abnormal; Notable for the following:    Glucose-Capillary 170 (*)    All other components within normal limits    Imaging Review No results found. I have personally reviewed and evaluated these images and lab results as part of my medical decision-making.   EKG Interpretation None      MDM    Final diagnoses:  Hypoglycemia due to type 1 diabetes mellitus   Patient is well in appearance she is alert and appropriate sitting up at the district her. She is neurologically intact. She advises that her hypoglycemia was due to missing meals and oversleeping. At this time there do not appear to be acute consultations. The patient is with a companion and appears well-educated on her diabetes management.    Arby Barrette, MD 05/30/15 [phone]

## 2017-10-12 ENCOUNTER — Other Ambulatory Visit: Payer: Self-pay | Admitting: Nurse Practitioner

## 2017-10-12 DIAGNOSIS — N6310 Unspecified lump in the right breast, unspecified quadrant: Secondary | ICD-10-CM

## 2017-10-12 DIAGNOSIS — N6320 Unspecified lump in the left breast, unspecified quadrant: Secondary | ICD-10-CM

## 2017-10-20 ENCOUNTER — Ambulatory Visit
Admission: RE | Admit: 2017-10-20 | Discharge: 2017-10-20 | Disposition: A | Discharge status: Home / Self Care | Payer: 59 | Source: Ambulatory Visit | Attending: Nurse Practitioner | Admitting: Nurse Practitioner

## 2017-10-20 DIAGNOSIS — N6320 Unspecified lump in the left breast, unspecified quadrant: Secondary | ICD-10-CM

## 2017-10-20 DIAGNOSIS — N6310 Unspecified lump in the right breast, unspecified quadrant: Secondary | ICD-10-CM

## 2020-08-28 ENCOUNTER — Other Ambulatory Visit: Payer: Self-pay

## 2020-08-28 ENCOUNTER — Encounter (HOSPITAL_COMMUNITY): Payer: Self-pay

## 2020-08-28 ENCOUNTER — Emergency Department (HOSPITAL_COMMUNITY): Payer: BC Managed Care – PPO

## 2020-08-28 ENCOUNTER — Inpatient Hospital Stay (HOSPITAL_COMMUNITY)
Admission: EM | Admit: 2020-08-28 | Discharge: 2020-08-30 | DRG: 638 | Disposition: A | Discharge status: Home / Self Care | Payer: BC Managed Care – PPO | Attending: Internal Medicine | Admitting: Internal Medicine

## 2020-08-28 DIAGNOSIS — B954 Other streptococcus as the cause of diseases classified elsewhere: Secondary | ICD-10-CM | POA: Diagnosis present

## 2020-08-28 DIAGNOSIS — D6489 Other specified anemias: Secondary | ICD-10-CM | POA: Diagnosis present

## 2020-08-28 DIAGNOSIS — N179 Acute kidney failure, unspecified: Secondary | ICD-10-CM

## 2020-08-28 DIAGNOSIS — N1330 Unspecified hydronephrosis: Secondary | ICD-10-CM

## 2020-08-28 DIAGNOSIS — Z9114 Patient's other noncompliance with medication regimen: Secondary | ICD-10-CM

## 2020-08-28 DIAGNOSIS — I10 Essential (primary) hypertension: Secondary | ICD-10-CM | POA: Diagnosis present

## 2020-08-28 DIAGNOSIS — R6510 Systemic inflammatory response syndrome (SIRS) of non-infectious origin without acute organ dysfunction: Secondary | ICD-10-CM

## 2020-08-28 DIAGNOSIS — E1049 Type 1 diabetes mellitus with other diabetic neurological complication: Secondary | ICD-10-CM | POA: Diagnosis present

## 2020-08-28 DIAGNOSIS — Z794 Long term (current) use of insulin: Secondary | ICD-10-CM

## 2020-08-28 DIAGNOSIS — N390 Urinary tract infection, site not specified: Secondary | ICD-10-CM | POA: Diagnosis not present

## 2020-08-28 DIAGNOSIS — E1110 Type 2 diabetes mellitus with ketoacidosis without coma: Secondary | ICD-10-CM | POA: Diagnosis present

## 2020-08-28 DIAGNOSIS — E876 Hypokalemia: Secondary | ICD-10-CM | POA: Diagnosis present

## 2020-08-28 DIAGNOSIS — Z833 Family history of diabetes mellitus: Secondary | ICD-10-CM | POA: Diagnosis not present

## 2020-08-28 DIAGNOSIS — Z20822 Contact with and (suspected) exposure to covid-19: Secondary | ICD-10-CM | POA: Diagnosis present

## 2020-08-28 DIAGNOSIS — F410 Panic disorder [episodic paroxysmal anxiety] without agoraphobia: Secondary | ICD-10-CM | POA: Diagnosis present

## 2020-08-28 DIAGNOSIS — E1010 Type 1 diabetes mellitus with ketoacidosis without coma: Secondary | ICD-10-CM | POA: Diagnosis present

## 2020-08-28 DIAGNOSIS — N136 Pyonephrosis: Secondary | ICD-10-CM | POA: Diagnosis present

## 2020-08-28 LAB — LACTIC ACID, PLASMA
Lactic Acid, Venous: 2.1 mmol/L (ref 0.5–1.9)
Lactic Acid, Venous: 1.1 mmol/L (ref 0.5–1.9)

## 2020-08-28 LAB — URINALYSIS, ROUTINE W REFLEX MICROSCOPIC
Bilirubin Urine: NEGATIVE
Glucose, UA: >=500 mg/dL — AB
Ketones, ur: 80 mg/dL — AB
Nitrite: NEGATIVE
Protein, ur: 30 mg/dL — AB
RBC / HPF: >50 RBC/hpf — ABNORMAL HIGH (ref 0–5)
Specific Gravity, Urine: 1.016 (ref 1.005–1.030)
WBC, UA: >50 WBC/hpf — ABNORMAL HIGH (ref 0–5)
pH: 5 (ref 5.0–8.0)

## 2020-08-28 LAB — CBC WITH DIFFERENTIAL/PLATELET
Abs Immature Granulocytes: 0.08 10*3/uL — ABNORMAL HIGH (ref 0.00–0.07)
Basophils Absolute: 0.1 10*3/uL (ref 0.0–0.1)
Basophils Relative: 0 %
Eosinophils Absolute: 0 10*3/uL (ref 0.0–0.5)
Eosinophils Relative: 0 %
HCT: 39.6 % (ref 36.0–46.0)
Hemoglobin: 13 g/dL (ref 12.0–15.0)
Immature Granulocytes: 1 %
Lymphocytes Relative: 3 %
Lymphs Abs: 0.5 10*3/uL — ABNORMAL LOW (ref 0.7–4.0)
MCH: 30.8 pg (ref 26.0–34.0)
MCHC: 32.8 g/dL (ref 30.0–36.0)
MCV: 93.8 fL (ref 80.0–100.0)
Monocytes Absolute: 0.8 10*3/uL (ref 0.1–1.0)
Monocytes Relative: 5 %
Neutro Abs: 14.6 10*3/uL — ABNORMAL HIGH (ref 1.7–7.7)
Neutrophils Relative %: 91 %
Platelets: 295 10*3/uL (ref 150–400)
RBC: 4.22 MIL/uL (ref 3.87–5.11)
RDW: 12.6 % (ref 11.5–15.5)
WBC: 16 10*3/uL — ABNORMAL HIGH (ref 4.0–10.5)
nRBC: 0 % (ref 0.0–0.2)

## 2020-08-28 LAB — BASIC METABOLIC PANEL
Anion gap: 15 (ref 5–15)
Anion gap: 13 (ref 5–15)
BUN: 13 mg/dL (ref 6–20)
BUN: 14 mg/dL (ref 6–20)
BUN: 12 mg/dL (ref 6–20)
CO2: <7 mmol/L — ABNORMAL LOW (ref 22–32)
CO2: 14 mmol/L — ABNORMAL LOW (ref 22–32)
CO2: 15 mmol/L — ABNORMAL LOW (ref 22–32)
Calcium: 6.3 mg/dL — CL (ref 8.9–10.3)
Calcium: 8.7 mg/dL — ABNORMAL LOW (ref 8.9–10.3)
Calcium: 8.6 mg/dL — ABNORMAL LOW (ref 8.9–10.3)
Chloride: 121 mmol/L — ABNORMAL HIGH (ref 98–111)
Chloride: 114 mmol/L — ABNORMAL HIGH (ref 98–111)
Chloride: 114 mmol/L — ABNORMAL HIGH (ref 98–111)
Creatinine, Ser: 0.8 mg/dL (ref 0.44–1.00)
Creatinine, Ser: 0.96 mg/dL (ref 0.44–1.00)
Creatinine, Ser: 0.86 mg/dL (ref 0.44–1.00)
GFR, Estimated: >60 mL/min (ref >60)
GFR, Estimated: >60 mL/min (ref >60)
GFR, Estimated: >60 mL/min (ref >60)
Glucose, Bld: 226 mg/dL — ABNORMAL HIGH (ref 70–99)
Glucose, Bld: 215 mg/dL — ABNORMAL HIGH (ref 70–99)
Glucose, Bld: 226 mg/dL — ABNORMAL HIGH (ref 70–99)
Potassium: 4.8 mmol/L (ref 3.5–5.1)
Potassium: 3.6 mmol/L (ref 3.5–5.1)
Potassium: 3.5 mmol/L (ref 3.5–5.1)
Sodium: 148 mmol/L — ABNORMAL HIGH (ref 135–145)
Sodium: 143 mmol/L (ref 135–145)
Sodium: 142 mmol/L (ref 135–145)

## 2020-08-28 LAB — BLOOD GAS, VENOUS
Acid-base deficit: 19 mmol/L — ABNORMAL HIGH (ref 0.0–2.0)
Bicarbonate: 7.6 mmol/L — ABNORMAL LOW (ref 20.0–28.0)
O2 Saturation: 73.2 %
Patient temperature: 98.6
pCO2, Ven: 20.3 mmHg — ABNORMAL LOW (ref 44.0–60.0)
pH, Ven: 7.201 — ABNORMAL LOW (ref 7.250–7.430)
pO2, Ven: 43.6 mmHg (ref 32.0–45.0)

## 2020-08-28 LAB — COMPREHENSIVE METABOLIC PANEL
ALT: 13 U/L (ref 0–44)
AST: 12 U/L — ABNORMAL LOW (ref 15–41)
Albumin: 3.9 g/dL (ref 3.5–5.0)
Alkaline Phosphatase: 74 U/L (ref 38–126)
Anion gap: 22 — ABNORMAL HIGH (ref 5–15)
BUN: 18 mg/dL (ref 6–20)
CO2: 8 mmol/L — ABNORMAL LOW (ref 22–32)
Calcium: 8.9 mg/dL (ref 8.9–10.3)
Chloride: 108 mmol/L (ref 98–111)
Creatinine, Ser: 1.12 mg/dL — ABNORMAL HIGH (ref 0.44–1.00)
GFR, Estimated: >60 mL/min (ref >60)
Glucose, Bld: 413 mg/dL — ABNORMAL HIGH (ref 70–99)
Potassium: 3.7 mmol/L (ref 3.5–5.1)
Sodium: 138 mmol/L (ref 135–145)
Total Bilirubin: 1.9 mg/dL — ABNORMAL HIGH (ref 0.3–1.2)
Total Protein: 8.1 g/dL (ref 6.5–8.1)

## 2020-08-28 LAB — GLUCOSE, CAPILLARY
Glucose-Capillary: 198 mg/dL — ABNORMAL HIGH (ref 70–99)
Glucose-Capillary: 195 mg/dL — ABNORMAL HIGH (ref 70–99)
Glucose-Capillary: 220 mg/dL — ABNORMAL HIGH (ref 70–99)
Glucose-Capillary: 202 mg/dL — ABNORMAL HIGH (ref 70–99)

## 2020-08-28 LAB — CBG MONITORING, ED
Glucose-Capillary: 346 mg/dL — ABNORMAL HIGH (ref 70–99)
Glucose-Capillary: 331 mg/dL — ABNORMAL HIGH (ref 70–99)
Glucose-Capillary: 274 mg/dL — ABNORMAL HIGH (ref 70–99)
Glucose-Capillary: 226 mg/dL — ABNORMAL HIGH (ref 70–99)

## 2020-08-28 LAB — I-STAT BETA HCG BLOOD, ED (MC, WL, AP ONLY): I-stat hCG, quantitative: <5 m[IU]/mL (ref <5)

## 2020-08-28 LAB — RESP PANEL BY RT-PCR (FLU A&B, COVID) ARPGX2
Influenza A by PCR: NEGATIVE
Influenza B by PCR: NEGATIVE
SARS Coronavirus 2 by RT PCR: NEGATIVE

## 2020-08-28 LAB — BETA-HYDROXYBUTYRIC ACID: Beta-Hydroxybutyric Acid: >8 mmol/L — ABNORMAL HIGH (ref 0.05–0.27)

## 2020-08-28 LAB — HEPATIC FUNCTION PANEL
ALT: 12 U/L (ref 0–44)
AST: 9 U/L — ABNORMAL LOW (ref 15–41)
Albumin: 3.5 g/dL (ref 3.5–5.0)
Alkaline Phosphatase: 63 U/L (ref 38–126)
Bilirubin, Direct: <0.1 mg/dL (ref 0.0–0.2)
Total Bilirubin: 1.4 mg/dL — ABNORMAL HIGH (ref 0.3–1.2)
Total Protein: 7.3 g/dL (ref 6.5–8.1)

## 2020-08-28 LAB — PROTIME-INR
INR: 1.2 (ref 0.8–1.2)
Prothrombin Time: 15.1 seconds (ref 11.4–15.2)

## 2020-08-28 LAB — MRSA PCR SCREENING: MRSA by PCR: NEGATIVE

## 2020-08-28 LAB — APTT: aPTT: 25 seconds (ref 24–36)

## 2020-08-28 LAB — PHOSPHORUS: Phosphorus: 1 mg/dL — CL (ref 2.5–4.6)

## 2020-08-28 LAB — MAGNESIUM: Magnesium: 1.7 mg/dL (ref 1.7–2.4)

## 2020-08-28 LAB — HIV ANTIBODY (ROUTINE TESTING W REFLEX): HIV Screen 4th Generation wRfx: NONREACTIVE

## 2020-08-28 MED ORDER — INSULIN REGULAR(HUMAN) IN NACL 100-0.9 UT/100ML-% IV SOLN
INTRAVENOUS | Status: DC
Start: 2020-08-28 — End: 2020-08-30
  Administered 2020-08-28: 16:00:00 7 [IU]/h via INTRAVENOUS
  Filled 2020-08-28: qty 100

## 2020-08-28 MED ORDER — K PHOS MONO-SOD PHOS DI & MONO 155-852-130 MG PO TABS
500.0000 mg | ORAL_TABLET | Freq: Four times a day (QID) | ORAL | Status: DC
  Administered 2020-08-29 – 2020-08-30 (×2): 500 mg via ORAL
  Filled 2020-08-28 (×6): qty 2

## 2020-08-28 MED ORDER — SODIUM CHLORIDE 0.9 % IV BOLUS
1000.0000 mL | Freq: Once | INTRAVENOUS | Status: AC
  Administered 2020-08-28: 14:00:00 1000 mL via INTRAVENOUS

## 2020-08-28 MED ORDER — POTASSIUM CHLORIDE 10 MEQ/100ML IV SOLN
10.0000 meq | INTRAVENOUS | Status: AC
  Administered 2020-08-28 (×2): 10 meq via INTRAVENOUS
  Filled 2020-08-28 (×2): qty 100

## 2020-08-28 MED ORDER — SODIUM CHLORIDE 0.9 % IV SOLN
1.0000 g | INTRAVENOUS | Status: DC
  Administered 2020-08-29: 19:00:00 1 g via INTRAVENOUS
  Filled 2020-08-28 (×2): qty 10

## 2020-08-28 MED ORDER — LACTATED RINGERS IV SOLN: INTRAVENOUS | Status: DC

## 2020-08-28 MED ORDER — DEXTROSE IN LACTATED RINGERS 5 % IV SOLN: INTRAVENOUS | Status: DC

## 2020-08-28 MED ORDER — SODIUM CHLORIDE 0.9 % IV SOLN
1.0000 g | Freq: Once | INTRAVENOUS | Status: AC
  Administered 2020-08-28: 20:00:00 1 g via INTRAVENOUS
  Filled 2020-08-28: qty 10

## 2020-08-28 MED ORDER — METOCLOPRAMIDE HCL 5 MG/ML IJ SOLN
5.0000 mg | Freq: Three times a day (TID) | INTRAMUSCULAR | Status: DC
  Administered 2020-08-28 – 2020-08-30 (×5): 5 mg via INTRAVENOUS
  Filled 2020-08-28 (×5): qty 2

## 2020-08-28 MED ORDER — SODIUM CHLORIDE 0.9 % IV BOLUS
1000.0000 mL | Freq: Once | INTRAVENOUS | Status: DC
Start: 2020-08-28 — End: 2020-08-30

## 2020-08-28 MED ORDER — CHLORHEXIDINE GLUCONATE CLOTH 2 % EX PADS
6.0000 | MEDICATED_PAD | Freq: Every day | CUTANEOUS | Status: DC
  Administered 2020-08-28: 19:00:00 6 via TOPICAL

## 2020-08-28 MED ORDER — DEXTROSE 50 % IV SOLN: 0.0000 mL | INTRAVENOUS | Status: DC | PRN

## 2020-08-28 MED ORDER — PROMETHAZINE HCL 25 MG/ML IJ SOLN
12.5000 mg | Freq: Once | INTRAMUSCULAR | Status: AC
  Administered 2020-08-28: 21:00:00 12.5 mg via INTRAVENOUS
  Filled 2020-08-28: qty 1

## 2020-08-28 MED ORDER — ONDANSETRON HCL 4 MG/2ML IJ SOLN
4.0000 mg | Freq: Once | INTRAMUSCULAR | Status: AC
  Administered 2020-08-28: 17:00:00 4 mg via INTRAVENOUS
  Filled 2020-08-28: qty 2

## 2020-08-28 MED ORDER — ENOXAPARIN SODIUM 40 MG/0.4ML ~~LOC~~ SOLN
40.0000 mg | SUBCUTANEOUS | Status: DC
  Administered 2020-08-28 – 2020-08-29 (×2): 40 mg via SUBCUTANEOUS
  Filled 2020-08-28 (×2): qty 0.4

## 2020-08-28 MED ORDER — MORPHINE SULFATE (PF) 4 MG/ML IV SOLN
4.0000 mg | Freq: Once | INTRAVENOUS | Status: DC
Start: 2020-08-28 — End: 2020-08-30

## 2020-08-28 MED ORDER — ONDANSETRON HCL 4 MG/2ML IJ SOLN: 4.0000 mg | Freq: Four times a day (QID) | INTRAMUSCULAR | Status: DC | PRN

## 2020-08-28 MED ORDER — POTASSIUM PHOSPHATES 15 MMOLE/5ML IV SOLN
30.0000 mmol | Freq: Once | INTRAVENOUS | Status: AC
  Administered 2020-08-28: 23:00:00 30 mmol via INTRAVENOUS
  Filled 2020-08-28: qty 10

## 2020-08-28 MED ORDER — ORAL CARE MOUTH RINSE
15.0000 mL | Freq: Two times a day (BID) | OROMUCOSAL | Status: DC
  Administered 2020-08-29 – 2020-08-30 (×3): 15 mL via OROMUCOSAL

## 2020-08-28 NOTE — ED Provider Notes (Signed)
[facility]-EMERGENCY DEPT Provider Note   CSN: [id number] Arrival date & time: 08/28/20  1319    History Chief Complaint  Patient presents with  . Hyperglycemia  . Nausea  . Vomiting    Chelsea Sparks is a 29 y.o. female with history significant for type 1 diabetes, who presents who presents for evaluation of double complaints.  Patient states over the last 3 days she has noted generalized abdominal pain, dysuria, hematuria, malodorous urine.  States this feels consistent with prior UTIs.  Occasionally has had some bilateral flank pain however none currently.  She has been able to urinate without difficulty.  States she did have large episode of diarrhea yesterday without melena or bright red blood per rectum.  She has been feeling overall unwell over the last few days.  She is not followed by PCP or endocrinology for her type 1 diabetes.  States she gets insulin at [facility] and has been out and has been able to afford additional insulin.  Previously on Levemir and had to transition to Novolin which she states has not been controlling her blood sugars.  Has had multiple episodes of NBNB emesis.  Multiple episodes of DKA in the past however this is not happened since she was in childhood.  States she feels generally weak and persistently nauseous.  States she does occasionally share her insulin with her significant other due to difficulty with access.  No fever or chills.  No chest pain, shortness of breath.  No lateral leg swelling, redness, warmth, paresthesias.  Denies additional aggravating or alleviating factors.  She is sexually active however denies any pelvic pain, vaginal discharge or concerns for STDs.  History obtained from patient and past medical records. No interpretor was used.  HPI     Past Medical History:  Diagnosis Date  . Anxiety   . Diabetes mellitus    Type 1  . Hypertension age 50  . Panic attacks     Patient Active Problem List    Diagnosis Date Noted  . DKA (diabetic ketoacidosis) (HCC) 08/28/2020  . Infected sebaceous cyst of skin 03/17/2012  . Neuropathy 03/03/2012  . 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 Constipation 12/31/2011  . Anxiety 12/24/2011    Past Surgical History:  Procedure Laterality Date  . CYSTOSCOPY    . NO PAST SURGERIES       OB History    Gravida  0   Para      Term      Preterm      AB      Living        SAB      IAB      Ectopic      Multiple      Live Births              Family History  Problem Relation Age of Onset  . Diabetes Father   . Diabetes Maternal Grandmother   . Breast cancer Maternal Aunt   . Heart attack Maternal Grandfather     Social History   Tobacco Use  . Smoking status: Never Smoker  . Smokeless tobacco: Never Used  Substance Use Topics  . Alcohol use: No  . Drug use: No    Home Medications Prior to Admission medications   Medication Sig Start Date End Date Taking? Authorizing Provider  insulin aspart (NOVOLOG) 100 UNIT/ML injection Inject 2-10  Units into the skin 3 (three) times daily before meals. 1 unit for every 15 grams of carbohydrates, and for corrective dose 1 unit for every 50/140   Yes [provider]  insulin detemir (LEVEMIR) 100 UNIT/ML injection Inject 0.09 mLs (9 Units total) into the skin at bedtime. Patient taking differently: Inject 13 Units into the skin at bedtime. 10/26/14  Yes Romero Belling, MD  insulin NPH Human (NOVOLIN N) 100 UNIT/ML injection Inject 15 Units into the skin at bedtime.   Yes [provider]  Multiple Vitamins-Minerals (MULTIVITAMIN WOMEN PO) Take 2 tablets by mouth daily.   Yes [provider]  insulin lispro (HUMALOG) 100 UNIT/ML injection Use 2-6 units 3 times per day per sliding scale. Patient not taking: Reported on 08/28/2020 11/09/14   Romero Belling, MD   metoCLOPramide (REGLAN) 10 MG tablet Take 1 tablet (10 mg total) by mouth every 6 (six) hours. Take every 6 hours as needed for nausea. Patient not taking: Reported on 08/28/2020 03/06/13   Santiago Glad, PA-C  ondansetron (ZOFRAN) 4 MG tablet Take 1 tablet (4 mg total) by mouth every 8 (eight) hours as needed for nausea or vomiting. Patient not taking: Reported on 08/28/2020 11/21/14   Veryl Speak, FNP    Allergies    Corn-containing products  Review of Systems   Review of Systems  Constitutional: Positive for activity change, appetite change and fatigue.  HENT: Negative.   Respiratory: Negative.   Cardiovascular: Negative.   Gastrointestinal: Positive for abdominal pain, diarrhea, nausea and vomiting. Negative for abdominal distention, anal bleeding, blood in stool, constipation and rectal pain.  Genitourinary: Positive for dysuria, flank pain, frequency, hematuria and urgency. Negative for decreased urine volume, genital sores, menstrual problem, pelvic pain, vaginal bleeding, vaginal discharge and vaginal pain.  Skin: Negative.   Neurological: Positive for weakness (Generalized weakness).  All other systems reviewed and are negative.   Physical Exam Updated Vital Signs BP 124/84   Pulse (!) 136   Resp (!) 22   Ht 5\' 5"  (1.651 m)   Wt 59 kg   SpO2 100%   BMI 21.63 kg/m   Physical Exam Vitals and nursing note reviewed.  Constitutional:      General: She is not in acute distress.    Appearance: She is well-developed and well-nourished. She is ill-appearing. She is not toxic-appearing.     Comments: Thin, ill appearing  HENT:     Head: Atraumatic.     Nose: Nose normal.     Mouth/Throat:     Mouth: Mucous membranes are dry.     Comments: Poor dentition Eyes:     Pupils: Pupils are equal, round, and reactive to light.  Cardiovascular:     Rate and Rhythm: Tachycardia present.     Pulses: Normal pulses and intact distal pulses.     Heart sounds: Normal heart  sounds.  Pulmonary:     Effort: Pulmonary effort is normal. No respiratory distress.     Breath sounds: Normal breath sounds.     Comments: Speaks in full sentences without difficulty.  Lungs clear to auscultation bilaterally Abdominal:     General: There is no distension.     Tenderness: There is abdominal tenderness. There is no right CVA tenderness, left CVA tenderness, guarding or rebound.     Comments: Suprapubic tenderness.  No rebound or guarding.  Negative CVA tap bilaterally.  Musculoskeletal:        General: Normal range of motion.  Cervical back: Normal range of motion.     Comments: Compartment soft.  No bony tenderness.  Moves all 4 extremities without difficulty.  Skin:    General: Skin is warm and dry.     Capillary Refill: Capillary refill takes 2 to 3 seconds.     Comments: No edema, erythema or warmth.  Neurological:     General: No focal deficit present.     Mental Status: She is alert and oriented to person, place, and time.     Comments: Cranial nerves II through XII grossly intact  Psychiatric:        Mood and Affect: Mood and affect normal.    ED Results / Procedures / Treatments   Labs (all labs ordered are listed, but only abnormal results are displayed) Labs Reviewed  LACTIC ACID, PLASMA - Abnormal; Notable for the following components:      Result Value   Lactic Acid, Venous 2.1 (*)    All other components within normal limits  COMPREHENSIVE METABOLIC PANEL - Abnormal; Notable for the following components:   CO2 8 (*)    Glucose, Bld 413 (*)    Creatinine, Ser 1.12 (*)    AST 12 (*)    Total Bilirubin 1.9 (*)    Anion gap 22 (*)    All other components within normal limits  CBC WITH DIFFERENTIAL/PLATELET - Abnormal; Notable for the following components:   WBC 16.0 (*)    Neutro Abs 14.6 (*)    Lymphs Abs 0.5 (*)    Abs Immature Granulocytes 0.08 (*)    All other components within normal limits  URINALYSIS, ROUTINE W REFLEX MICROSCOPIC -  Abnormal; Notable for the following components:   APPearance CLOUDY (*)    Glucose, UA >=500 (*)    Hgb urine dipstick MODERATE (*)    Ketones, ur 80 (*)    Protein, ur 30 (*)    Leukocytes,Ua LARGE (*)    RBC / HPF >50 (*)    WBC, UA >50 (*)    Bacteria, UA RARE (*)    Crystals PRESENT (*)    All other components within normal limits  BETA-HYDROXYBUTYRIC ACID - Abnormal; Notable for the following components:   Beta-Hydroxybutyric Acid >8.00 (*)    All other components within normal limits  BLOOD GAS, VENOUS - Abnormal; Notable for the following components:   pH, Ven 7.201 (*)    pCO2, Ven 20.3 (*)    Bicarbonate 7.6 (*)    Acid-base deficit 19.0 (*)    All other components within normal limits  CBG MONITORING, ED - Abnormal; Notable for the following components:   Glucose-Capillary 346 (*)    All other components within normal limits  CBG MONITORING, ED - Abnormal; Notable for the following components:   Glucose-Capillary 331 (*)    All other components within normal limits  CBG MONITORING, ED - Abnormal; Notable for the following components:   Glucose-Capillary 274 (*)    All other components within normal limits  RESP PANEL BY RT-PCR (FLU A&B, COVID) ARPGX2  URINE CULTURE  CULTURE, BLOOD (ROUTINE X 2)  CULTURE, BLOOD (ROUTINE X 2)  PROTIME-INR  APTT  LACTIC ACID, PLASMA  BASIC METABOLIC PANEL  BASIC METABOLIC PANEL  BASIC METABOLIC PANEL  BASIC METABOLIC PANEL  I-STAT BETA HCG BLOOD, ED ([facility], [facility], [facility] ONLY)    EKG None    Radiology DG Chest Port 1 View  Result Date: 08/28/2020 CLINICAL DATA:  Questionable sepsis. Evaluate for abnormality. Additional  history provided: Nausea and diarrhea. EXAM: PORTABLE CHEST 1 VIEW COMPARISON:  Prior chest radiographs 08/31/2012. FINDINGS: Heart size within normal limits. No appreciable airspace consolidation.No evidence of pleural effusion or pneumothorax. No acute bony abnormality identified. IMPRESSION: No evidence of active  cardiopulmonary disease. Electronically Signed   By: Jackey LogeKyle  Golden DO   On: 08/28/2020 14:43   CT Renal Stone Study  Result Date: 08/28/2020 CLINICAL DATA:  Bilateral flank pain and nausea EXAM: CT ABDOMEN AND PELVIS WITHOUT CONTRAST TECHNIQUE: Multidetector CT imaging of the abdomen and pelvis was performed following the standard protocol without IV contrast. COMPARISON:  None. FINDINGS: Lower chest: The visualized heart size within normal limits. No pericardial fluid/thickening. No hiatal hernia. The visualized portions of the lungs are clear. Hepatobiliary: Although limited due to the lack of intravenous contrast, normal in appearance without gross focal abnormality. No evidence of calcified gallstones or biliary ductal dilatation. Pancreas:  Unremarkable.  No surrounding inflammatory changes. Spleen: Normal in size. Although limited due to the lack of intravenous contrast, normal in appearance. Adrenals/Urinary Tract: Both adrenal glands appear normal. Mild bilateral pelvicaliectasis, right greater than left is seen down to the level of the UVJ. No renal or collecting system calculi however are noted. There is a dilated fluid-filled bladder without bladder wall thickening. Stomach/Bowel: The stomach, small bowel, and colon are normal in appearance. No inflammatory changes or obstructive findings. appendix is normal. Vascular/Lymphatic: There are no enlarged abdominal or pelvic lymph nodes. Scattered mild aortic atherosclerosis is seen. Reproductive: The uterus and adnexa are unremarkable. Other: No evidence of abdominal wall mass or hernia. Musculoskeletal: No acute or significant osseous findings. IMPRESSION: Mild bilateral hydronephrosis to the UVJ with a significantly dilated fluid-filled bladder which could be due to bladder outlet obstruction. No renal or collecting system calculi Aortic Atherosclerosis (ICD10-I70.0).  Advanced for age Electronically Signed   By: Jonna ClarkBindu  Avutu M.D.   On: 08/28/2020 15:46     Procedures .Critical Care Performed by: Linwood DibblesHenderly, Rigoberto Repass A, PA-C Authorized by: Linwood DibblesHenderly, Amnah Breuer A, PA-C   Critical care provider statement:    Critical care time (minutes):  45   Critical care was necessary to treat or prevent imminent or life-threatening deterioration of the following conditions:  Sepsis and endocrine crisis   Critical care was time spent personally by me on the following activities:  Discussions with consultants, evaluation of patient's response to treatment, examination of patient, ordering and performing treatments and interventions, ordering and review of laboratory studies, ordering and review of radiographic studies, pulse oximetry, re-evaluation of patient's condition, obtaining history from patient or surrogate and review of old charts   (including critical care time)  Medications Ordered in ED Medications  insulin regular, human (MYXREDLIN) 100 units/ 100 mL infusion (5.5 Units/hr Intravenous Rate/Dose Change 08/28/20 1706)  lactated ringers infusion ( Intravenous New Bag/Given 08/28/20 1603)  dextrose 5 % in lactated ringers infusion (has no administration in time range)  dextrose 50 % solution 0-50 mL (has no administration in time range)  potassium chloride 10 mEq in 100 mL IVPB (10 mEq Intravenous New Bag/Given 08/28/20 1714)  cefTRIAXone (ROCEPHIN) 1 g in sodium chloride 0.9 % 100 mL IVPB (has no administration in time range)  morphine 4 MG/ML injection 4 mg (4 mg Intravenous Patient Refused/Not Given 08/28/20 1725)  sodium chloride 0.9 % bolus 1,000 mL (has no administration in time range)  sodium chloride 0.9 % bolus 1,000 mL (0 mLs Intravenous Stopped 08/28/20 1715)  ondansetron (ZOFRAN) injection 4 mg (4 mg Intravenous  Given 08/28/20 1725)   ED Course  I have reviewed the triage vital signs and the nursing notes.  Pertinent labs & imaging results that were available during my care of the patient were reviewed by me and considered in my medical  decision making (see chart for details).  29 year old, type I diabetic, noncompliant with medications presents for evaluation of urinary symptoms as well as possible DKA.  On arrival patient does not appear septic however does appear ill.  She is tachypneic.  She is afebrile.  Heart and lungs clear.  Her abdomen does show some suprapubic tenderness.  She does admit to dysuria, hematuria and urinary frequency.  She does feel like she is able to fully be her bladder.  Intermittently has bilateral flank pain however states this has resolved and does not have any at present.  No known Covid exposures.  No respiratory symptoms.  Plan on labs, imaging and reassess.  Labs and imaging personally reviewed and interpreted:  CBC with leukocytosis at 16.0 Metabolic panel with hyperglycemia, bicarb 8, creatinine 1.12, up from prior, T bili 1.9 however, negative Murphy sign.  No evidence of obstructing stones on CT scan, anion gap 22  Beta hydroxy greater than 8 Lactic acid 2.1 VBG with acidosis pH 7.2, Bicarb 4.6 UA positive for infection, culture sent Preg negative Covid negative CT renal stone with moderate bilateral hydronephrosis, no visualized obstructing stone.  No bladder wall thickening Plain film chest without evidence of infiltrates, cardiomegaly, pulmonary edema  CT scan with moderate hydro.  Patient after CT scan was able to urinate a significant amount.  Bladder scan without retention.  Low suspicion for bladder outlet obstruction given no significant tension after urination.  UA is positive for UTI.  She was given Rocephin.  Will need to be admitted for DKA, UTI,?  Sepsis.  No temperature computer however nursing obtain temperature with self in room at 97.9.  Total 2,750 IVF thus far. Getting IVF Abx here in ED as well as DKA protocol and insulin drip.  Patient critically ill with UTI,  ? Sepsis as well as DKA.  Will need admission for further management.  CONSULT with Dr. Blake Divine with [facility] who  will evaluate patient for admission.  The patient appears reasonably stabilized for admission considering the current resources, flow, and capabilities available in the ED at this time, and I doubt any other [facility] requiring further screening and/or treatment in the ED prior to admission.   Patient seen eval by attending, Dr. Clarice Pole who agrees with above treatment, plan and disposition.    MDM Rules/Calculators/A&P                           Final Clinical Impression(s) / ED Diagnoses Final diagnoses:  Diabetic ketoacidosis without coma associated with type 1 diabetes mellitus (HCC)  SIRS (systemic inflammatory response syndrome) (HCC)  Lower urinary tract infectious disease  AKI (acute kidney injury) [facility])    Rx / DC Orders ED Discharge Orders    None       Allyson Tineo A, PA-C 08/28/20 1742    Arby Barrette, MD 08/29/20 [phone]

## 2020-08-28 NOTE — ED Provider Notes (Signed)
Medical screening examination/treatment/procedure(s) were conducted as a shared visit with non-physician practitioner(s) and myself.  I personally evaluated the patient during the encounter.    Patient reports symptoms started fairly acutely yesterday.  She reports she started vomiting.  She had multiple episodes of vomiting.  She also had fairly large episode of diarrhea last night.  The diarrhea has not persisted but the vomiting has.  She reports she has diffuse abdominal discomfort.  Patient reports she ran out of her Levemir and had to transition Novolin which she reports is not working as well.  She reports as a teenager she had multiple episodes of DKA but has not had it recently.  She is extremely weak and persistently nauseated  Patient is awake and alert.  She is severely ill in appearance.  Very thin.  Pale.  Heart tachycardic no gross rub or gallop.  Lungs grossly clear.  Abdomen nondistended.  Lower extremities no wounds.  No cellulitis.  Feet are warm and dry with distal pulses intact.  Mucous membranes are dry with very poor dentition.  Patient is acutely ill.  I agree with plan and management of rehydration and insulin drip.   Arby Barrette, MD 08/28/20 [phone]

## 2020-08-28 NOTE — H&P (Signed)
History and Physical    Chelsea Sparks FTD:[id number] DOB: [birth year] DOA: 08/28/2020  PCP: Chelsea Sparks, No Pcp Per Chelsea Sparks coming from: Home.   I have personally briefly reviewed Chelsea Sparks's old medical records in [facility]  Chief Complaint:   HPI: Chelsea Sparks is a 29 y.o. female with medical history significant of  Type 1 DM, anxiety, hypertension, panic attacks, prsented to ED with complaints of nausea, vomiting, loose bowel movements since two days associated with foul smelling urine, dysuria and mild back pain. She reports missing two days of insulin . She reports she was using levemir 13 units daily, ran out of it and used NOVOLIN 15 UNITS without any results. She reports using her boyfriend's insulin as she does not have PCP and no source of insulin.   ED Course:  She was found to be afebrile, tachycardic with HR of 126/min, tachypnea 22/min, normotensive. Labs reveals leukocytosis of 16,000, bicarb of 8, creatinine of 1.12 , anion gap of 22, lactic acid of 2.1. UA shows  cloudy urine, large leukocytes, and rare bacteria, pyuria . Urine cultures sent , blood cultures done. CT renal study shows bilateral mild hydronephrosis with UVJ with a significantly dilated fluid-filled bladder which could be due to bladder outlet Obstruction.  CXR does not show any pneumonia.   She was referred to medical service for DKA AND UTI.   Review of Systems: As per HPI otherwise  "All others reviewed and are negative  Past Medical History:  Diagnosis Date  . Anxiety   . Diabetes mellitus    Type 1  . Hypertension age 89  . Panic attacks     Past Surgical History:  Procedure Laterality Date  . CYSTOSCOPY    . NO PAST SURGERIES      Social History  reports that she has never smoked. She has never used smokeless tobacco. She reports that she does not drink alcohol and does not use drugs.  Allergies  Allergen Reactions  . Corn-Containing Products Nausea And Vomiting    Family  History  Problem Relation Age of Onset  . Diabetes Father   . Diabetes Maternal Grandmother   . Breast cancer Maternal Aunt   . Heart attack Maternal Grandfather    FAMILY HISTORY OF DM present.   Prior to Admission medications   Medication Sig Start Date End Date Taking? Authorizing Provider  insulin aspart (NOVOLOG) 100 UNIT/ML injection Inject 2-10 Units into the skin 3 (three) times daily before meals. 1 unit for every 15 grams of carbohydrates, and for corrective dose 1 unit for every 50/140   Yes [provider]  insulin detemir (LEVEMIR) 100 UNIT/ML injection Inject 0.09 mLs (9 Units total) into the skin at bedtime. Chelsea Sparks taking differently: Inject 13 Units into the skin at bedtime. 10/26/14  Yes Romero Belling, MD  insulin NPH Human (NOVOLIN N) 100 UNIT/ML injection Inject 15 Units into the skin at bedtime.   Yes [provider]  Multiple Vitamins-Minerals (MULTIVITAMIN WOMEN PO) Take 2 tablets by mouth daily.   Yes [provider]  insulin lispro (HUMALOG) 100 UNIT/ML injection Use 2-6 units 3 times per day per sliding scale. Chelsea Sparks not taking: Reported on 08/28/2020 11/09/14   Romero Belling, MD  metoCLOPramide (REGLAN) 10 MG tablet Take 1 tablet (10 mg total) by mouth every 6 (six) hours. Take every 6 hours as needed for nausea. Chelsea Sparks not taking: Reported on 08/28/2020 03/06/13   Santiago Glad, PA-C  ondansetron (ZOFRAN) 4 MG tablet Take  1 tablet (4 mg total) by mouth every 8 (eight) hours as needed for nausea or vomiting. Chelsea Sparks not taking: Reported on 08/28/2020 11/21/14   Veryl Speakalone, Gregory D, FNP    Physical Exam: ill appearing young lady, not in distress.  Vitals:   08/28/20 1446 08/28/20 1543 08/28/20 1646  BP: 97/71  124/84  Pulse: (!) 128  (!) 136  Resp: 17  (!) 22  SpO2: 100%  100%  Weight:  59 kg   Height:  5\' 5"  (1.651 m)     Constitutional: NAD, calm, comfortable Vitals:   08/28/20 1446 08/28/20 1543 08/28/20 1646  BP: 97/71   124/84  Pulse: (!) 128  (!) 136  Resp: 17  (!) 22  SpO2: 100%  100%  Weight:  59 kg   Height:  5\' 5"  (1.651 m)    Eyes: PERRL, lids and conjunctivae normal ENMT: Mucous membranes are dry.  Neck: normal, supple, no masses, no thyromegaly Respiratory: clear to auscultation bilaterally, no wheezing, no crackles. Normal respiratory effort. No accessory muscle use.  Cardiovascular: Regular rate and rhythm, No extremity edema. 2+ pedal pulses. No carotid bruits.  Abdomen: no tenderness, no masses palpated.  Bowel sounds positive.  Musculoskeletal: no clubbing / cyanosis. .  Skin: no rashes, lesions, ulcers. No induration Neurologic: CN 2-12 grossly intact. Sensation intact, DTR normal. Strength 5/5 in all 4.  Psychiatric: Normal judgment and insight. Alert and oriented x 3. Normal mood.     Labs on Admission: I have personally reviewed following labs and imaging studies  CBC: Recent Labs  Lab 08/28/20 1400  WBC 16.0*  NEUTROABS 14.6*  HGB 13.0  HCT 39.6  MCV 93.8  PLT 295    Basic Metabolic Panel: Recent Labs  Lab 08/28/20 1400  NA 138  K 3.7  CL 108  CO2 8*  GLUCOSE 413*  BUN 18  CREATININE 1.12*  CALCIUM 8.9    GFR: Estimated Creatinine Clearance: 66.1 mL/min (A) (by C-G formula based on SCr of 1.12 mg/dL (H)).  Liver Function Tests: Recent Labs  Lab 08/28/20 1400  AST 12*  ALT 13  ALKPHOS 74  BILITOT 1.9*  PROT 8.1  ALBUMIN 3.9    Urine analysis:    Component Value Date/Time   COLORURINE YELLOW 08/28/2020 1611   APPEARANCEUR CLOUDY (A) 08/28/2020 1611   LABSPEC 1.016 08/28/2020 1611   PHURINE 5.0 08/28/2020 1611   GLUCOSEU >=500 (A) 08/28/2020 1611   GLUCOSEU NEGATIVE 03/03/2012 1020   HGBUR MODERATE (A) 08/28/2020 1611   BILIRUBINUR NEGATIVE 08/28/2020 1611   BILIRUBINUR neg 11/20/2011 1311   KETONESUR 80 (A) 08/28/2020 1611   PROTEINUR 30 (A) 08/28/2020 1611   UROBILINOGEN 0.2 03/21/2014 1546   NITRITE NEGATIVE 08/28/2020 1611    LEUKOCYTESUR LARGE (A) 08/28/2020 1611    Radiological Exams on Admission: DG Chest Port 1 View  Result Date: 08/28/2020 CLINICAL DATA:  Questionable sepsis. Evaluate for abnormality. Additional history provided: Nausea and diarrhea. EXAM: PORTABLE CHEST 1 VIEW COMPARISON:  Prior chest radiographs 08/31/2012. FINDINGS: Heart size within normal limits. No appreciable airspace consolidation.No evidence of pleural effusion or pneumothorax. No acute bony abnormality identified. IMPRESSION: No evidence of active cardiopulmonary disease. Electronically Signed   By: Jackey LogeKyle  Golden DO   On: 08/28/2020 14:43   CT Renal Stone Study  Result Date: 08/28/2020 CLINICAL DATA:  Bilateral flank pain and nausea EXAM: CT ABDOMEN AND PELVIS WITHOUT CONTRAST TECHNIQUE: Multidetector CT imaging of the abdomen and pelvis was performed following the standard protocol without  IV contrast. COMPARISON:  None. FINDINGS: Lower chest: The visualized heart size within normal limits. No pericardial fluid/thickening. No hiatal hernia. The visualized portions of the lungs are clear. Hepatobiliary: Although limited due to the lack of intravenous contrast, normal in appearance without gross focal abnormality. No evidence of calcified gallstones or biliary ductal dilatation. Pancreas:  Unremarkable.  No surrounding inflammatory changes. Spleen: Normal in size. Although limited due to the lack of intravenous contrast, normal in appearance. Adrenals/Urinary Tract: Both adrenal glands appear normal. Mild bilateral pelvicaliectasis, right greater than left is seen down to the level of the UVJ. No renal or collecting system calculi however are noted. There is a dilated fluid-filled bladder without bladder wall thickening. Stomach/Bowel: The stomach, small bowel, and colon are normal in appearance. No inflammatory changes or obstructive findings. appendix is normal. Vascular/Lymphatic: There are no enlarged abdominal or pelvic lymph nodes.  Scattered mild aortic atherosclerosis is seen. Reproductive: The uterus and adnexa are unremarkable. Other: No evidence of abdominal wall mass or hernia. Musculoskeletal: No acute or significant osseous findings. IMPRESSION: Mild bilateral hydronephrosis to the UVJ with a significantly dilated fluid-filled bladder which could be due to bladder outlet obstruction. No renal or collecting system calculi Aortic Atherosclerosis (ICD10-I70.0).  Advanced for age Electronically Signed   By: Jonna Clark M.D.   On: 08/28/2020 15:46    EKG: none.  Assessment/Plan Active Problems:   DKA (diabetic ketoacidosis) (HCC)     DKA Probably secondary to running out of levemir and possibly from UTI.  Improving CBGS' repeat BMP in 4 hours and if bicarb normalizes and anion gap remains closed, plan to transition to Levemir 12 units daily with sensitive SSI.  TOC consulted for PCP and medication assistance.  DM co ordinator consult will be placed.  Continue with hydration.     Nausea, vomiting, diarrhea probably secondary to DKA and UTI Symptomatic management with IV anti emetics, IV fluids and pain control.  Start her on clears and advance as tolerated.    UTI: Urine cultures and blood cultures sent, started her on IV ROCEPHIN.     Bilateral Hydronephrosis with  with UVJ with a significantly dilated fluid-filled bladder which could be due to bladder outlet Obstruction.  she is urinating , check strict intake and output.  Will get urology input in am.     DVT prophylaxis:  (Lovenox) Code Status:    (Full code) Family Communication:  None at bedside.  Disposition Plan:   Chelsea Sparks is from:  Home.   Anticipated DC to:  Home.   Anticipated DC date:  In 2 to 3 days.   Anticipated DC barriers: DKA, UTI, bilateral hydronephrosis.  Consults called:  None.  Admission status:  Inpatient, stepdown  Severity of Illness: The appropriate Chelsea Sparks status for this Chelsea Sparks is INPATIENT. Inpatient status is  judged to be reasonable and necessary in order to provide the required intensity of service to ensure the Chelsea Sparks's safety. The Chelsea Sparks's presenting symptoms, physical exam findings, and initial radiographic and laboratory data in the context of their chronic comorbidities is felt to place them at high risk for further clinical deterioration. Furthermore, it is not anticipated that the Chelsea Sparks will be medically stable for discharge from the hospital within 2 midnights of admission.    * I certify that at the point of admission it is my clinical judgment that the Chelsea Sparks will require inpatient hospital care spanning beyond 2 midnights from the point of admission due to high intensity of service, high risk for  further deterioration and high frequency of surveillance required.*     Kathlen Mody MD [facility]  How to contact the [facility] Attending or Consulting provider 7A - 7P or covering provider during after hours 7P -7A, for this Chelsea Sparks?   1. Check the care team in [facility] and look for a) attending/consulting [facility] provider listed and b) the [facility] team listed 2. Log into [email or web address] and use [facility] universal password to access. If you do not have the password, please contact the hospital operator. 3. Locate the [facility] provider you are looking for under [facility] and page to a number that you can be directly reached. 4. If you still have difficulty reaching the provider, please page the [facility] (Director on Call) for the Hospitalists listed on amion for assistance.  08/28/2020, 6:36 PM

## 2020-08-28 NOTE — ED Triage Notes (Signed)
Pt BIB EMS. Pt c/o of nausea and diarrhea that started last night. Pt hx of DM, and DKA. EMS gave 4 zofran and 750cc fluids.   CBG-492 BP-140/90

## 2020-08-29 DIAGNOSIS — N179 Acute kidney failure, unspecified: Secondary | ICD-10-CM

## 2020-08-29 DIAGNOSIS — N1330 Unspecified hydronephrosis: Secondary | ICD-10-CM

## 2020-08-29 LAB — GLUCOSE, CAPILLARY
Glucose-Capillary: 125 mg/dL — ABNORMAL HIGH (ref 70–99)
Glucose-Capillary: 130 mg/dL — ABNORMAL HIGH (ref 70–99)
Glucose-Capillary: 143 mg/dL — ABNORMAL HIGH (ref 70–99)
Glucose-Capillary: 147 mg/dL — ABNORMAL HIGH (ref 70–99)
Glucose-Capillary: 156 mg/dL — ABNORMAL HIGH (ref 70–99)
Glucose-Capillary: 167 mg/dL — ABNORMAL HIGH (ref 70–99)
Glucose-Capillary: 169 mg/dL — ABNORMAL HIGH (ref 70–99)
Glucose-Capillary: 172 mg/dL — ABNORMAL HIGH (ref 70–99)
Glucose-Capillary: 176 mg/dL — ABNORMAL HIGH (ref 70–99)
Glucose-Capillary: 181 mg/dL — ABNORMAL HIGH (ref 70–99)
Glucose-Capillary: 183 mg/dL — ABNORMAL HIGH (ref 70–99)
Glucose-Capillary: 194 mg/dL — ABNORMAL HIGH (ref 70–99)
Glucose-Capillary: 194 mg/dL — ABNORMAL HIGH (ref 70–99)
Glucose-Capillary: 200 mg/dL — ABNORMAL HIGH (ref 70–99)
Glucose-Capillary: 202 mg/dL — ABNORMAL HIGH (ref 70–99)
Glucose-Capillary: 211 mg/dL — ABNORMAL HIGH (ref 70–99)
Glucose-Capillary: 224 mg/dL — ABNORMAL HIGH (ref 70–99)
Glucose-Capillary: 247 mg/dL — ABNORMAL HIGH (ref 70–99)
Glucose-Capillary: 99 mg/dL (ref 70–99)
Glucose-Capillary: 99 mg/dL (ref 70–99)

## 2020-08-29 LAB — CBC
HCT: 30.7 % — ABNORMAL LOW (ref 36.0–46.0)
Hemoglobin: 10.5 g/dL — ABNORMAL LOW (ref 12.0–15.0)
MCH: 30.6 pg (ref 26.0–34.0)
MCHC: 34.2 g/dL (ref 30.0–36.0)
MCV: 89.5 fL (ref 80.0–100.0)
Platelets: 245 10*3/uL (ref 150–400)
RBC: 3.43 MIL/uL — ABNORMAL LOW (ref 3.87–5.11)
RDW: 12.8 % (ref 11.5–15.5)
WBC: 11.6 10*3/uL — ABNORMAL HIGH (ref 4.0–10.5)
nRBC: 0 % (ref 0.0–0.2)

## 2020-08-29 LAB — BASIC METABOLIC PANEL
Anion gap: 11 (ref 5–15)
Anion gap: 12 (ref 5–15)
Anion gap: 9 (ref 5–15)
BUN: 11 mg/dL (ref 6–20)
BUN: 10 mg/dL (ref 6–20)
BUN: 7 mg/dL (ref 6–20)
CO2: 17 mmol/L — ABNORMAL LOW (ref 22–32)
CO2: 18 mmol/L — ABNORMAL LOW (ref 22–32)
CO2: 20 mmol/L — ABNORMAL LOW (ref 22–32)
Calcium: 8.3 mg/dL — ABNORMAL LOW (ref 8.9–10.3)
Calcium: 8.4 mg/dL — ABNORMAL LOW (ref 8.9–10.3)
Calcium: 8.2 mg/dL — ABNORMAL LOW (ref 8.9–10.3)
Chloride: 113 mmol/L — ABNORMAL HIGH (ref 98–111)
Chloride: 112 mmol/L — ABNORMAL HIGH (ref 98–111)
Chloride: 109 mmol/L (ref 98–111)
Creatinine, Ser: 0.75 mg/dL (ref 0.44–1.00)
Creatinine, Ser: 0.63 mg/dL (ref 0.44–1.00)
Creatinine, Ser: 0.66 mg/dL (ref 0.44–1.00)
GFR, Estimated: >60 mL/min (ref >60)
GFR, Estimated: >60 mL/min (ref >60)
GFR, Estimated: >60 mL/min (ref >60)
Glucose, Bld: 241 mg/dL — ABNORMAL HIGH (ref 70–99)
Glucose, Bld: 142 mg/dL — ABNORMAL HIGH (ref 70–99)
Glucose, Bld: 212 mg/dL — ABNORMAL HIGH (ref 70–99)
Potassium: 3.4 mmol/L — ABNORMAL LOW (ref 3.5–5.1)
Potassium: 3 mmol/L — ABNORMAL LOW (ref 3.5–5.1)
Potassium: 3.3 mmol/L — ABNORMAL LOW (ref 3.5–5.1)
Sodium: 141 mmol/L (ref 135–145)
Sodium: 142 mmol/L (ref 135–145)
Sodium: 138 mmol/L (ref 135–145)

## 2020-08-29 LAB — MAGNESIUM: Magnesium: 1.5 mg/dL — ABNORMAL LOW (ref 1.7–2.4)

## 2020-08-29 LAB — PHOSPHORUS: Phosphorus: 3.6 mg/dL (ref 2.5–4.6)

## 2020-08-29 LAB — BETA-HYDROXYBUTYRIC ACID: Beta-Hydroxybutyric Acid: 1.18 mmol/L — ABNORMAL HIGH (ref 0.05–0.27)

## 2020-08-29 MED ORDER — POTASSIUM CHLORIDE CRYS ER 20 MEQ PO TBCR: 40.0000 meq | EXTENDED_RELEASE_TABLET | Freq: Once | ORAL | Status: DC

## 2020-08-29 MED ORDER — MAGNESIUM SULFATE 50 % IJ SOLN
3.0000 g | Freq: Once | INTRAVENOUS | Status: AC
  Administered 2020-08-29: 10:00:00 3 g via INTRAVENOUS
  Filled 2020-08-29: qty 6

## 2020-08-29 MED ORDER — INSULIN ASPART 100 UNIT/ML ~~LOC~~ SOLN: 0.0000 [IU] | Freq: Three times a day (TID) | SUBCUTANEOUS | Status: DC

## 2020-08-29 MED ORDER — INSULIN DETEMIR 100 UNIT/ML ~~LOC~~ SOLN
15.0000 [IU] | Freq: Every day | SUBCUTANEOUS | Status: DC
  Administered 2020-08-29 – 2020-08-30 (×2): 15 [IU] via SUBCUTANEOUS
  Filled 2020-08-29 (×2): qty 0.15

## 2020-08-29 MED ORDER — POTASSIUM CHLORIDE CRYS ER 20 MEQ PO TBCR
40.0000 meq | EXTENDED_RELEASE_TABLET | Freq: Once | ORAL | Status: AC
  Administered 2020-08-29: 10:00:00 40 meq via ORAL
  Filled 2020-08-29: qty 2

## 2020-08-29 NOTE — Progress Notes (Signed)
Nutrition Brief Note  Patient identified on the Malnutrition Screening Tool (MST) Report  Patient reports no weight loss. No weight loss noted in weight records. Pt states she typically eats 2 meals at home. Encouraged her to start including a small breakfast in the morning made up of carbohydrates and protein. Pt with no diet related questions for RD. Encouraged pt to order lunch. States she feels hungry.  Wt Readings from Last 15 Encounters:  08/28/20 59 kg  11/21/14 55.8 kg  10/26/14 56.7 kg  03/21/14 54.4 kg  02/06/13 48.1 kg  07/06/12 49.9 kg  04/13/12 46.7 kg  03/17/12 46.7 kg  03/03/12 45.8 kg  01/07/12 49.4 kg  01/07/12 49.6 kg  12/31/11 50.2 kg  12/30/11 50.3 kg  12/24/11 50 kg  12/17/11 48.5 kg    Body mass index is 21.63 kg/m. Patient meets criteria for normal based on current BMI.   Current diet order is heart healthy/CHO modified.  Labs and medications reviewed.   No nutrition interventions warranted at this time. If nutrition issues arise, please consult RD.   Tilda Franco, MS, RD, LDN Inpatient Clinical Dietitian Contact information available via Amion

## 2020-08-29 NOTE — Plan of Care (Signed)
  Problem: Education: Goal: Knowledge of General Education information will improve Description: Including pain rating scale, medication(s)/side effects and non-pharmacologic comfort measures Outcome: Progressing   Problem: Health Behavior/Discharge Planning: Goal: Ability to manage health-related needs will improve Outcome: Progressing   Problem: Clinical Measurements: Goal: Will remain free from infection Outcome: Progressing   Problem: Clinical Measurements: Goal: Diagnostic test results will improve Outcome: Progressing   Problem: Activity: Goal: Risk for activity intolerance will decrease Outcome: Progressing   Problem: Elimination: Goal: Will not experience complications related to bowel motility Outcome: Progressing    Problem: Elimination: Goal: Will not experience complications related to urinary retention Outcome: Progressing   Problem: Nutritional: Goal: Maintenance of adequate nutrition will improve Outcome: Progressing   Problem: Metabolic: Goal: Ability to maintain appropriate glucose levels will improve Outcome: Progressing

## 2020-08-29 NOTE — Progress Notes (Signed)
PROGRESS NOTE    Chelsea Sparks  OIZ:[id number] DOB: 11-10-1990 DOA: 08/28/2020 PCP: Patient, No Pcp Per    Chief Complaint  Patient presents with  . Hyperglycemia  . Nausea  . Vomiting    Brief Narrative:  Chelsea Sparks is a 29 y.o. female with medical history significant of  Type 1 DM, anxiety, hypertension, panic attacks, prsented to ED with complaints of nausea, vomiting, loose bowel movements since two days associated with foul smelling urine, dysuria and mild back pain. She reports missing two days of insulin . She was found to be in DKA, UTI and mild bilateral hydronephrosis.   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  cbg's are are less than 200 this am.  Recommend to continue with insulin gtt.  Check BMP every 4 hours,  Transition to Levemir 15 units daily with SSI.  TOC consult for meds and PCP.    Mild AKI Probably from DKA, resolved with IV fluids.    UTI with bilateral hydronephrosis:  Resume IV rocephin, urine and blood cultures are pending.  Pt urinating adequately.    Hypokalemia and hypomagnesemia:  Replaced.     Anemia , normocytic. Marland Kitchen  Suspect the hemoglobin of 13 on admission is from a concentrated sample from DKA.  Dropped to 10 after IV fluids, ? Hemodilution or baseline hemoglobin.  Continue to monitor.      DVT prophylaxis: (Lovenox) Code Status:full code.  Family Communication: none at bedside.  Disposition:   Status is: Inpatient  Remains inpatient appropriate because:Ongoing diagnostic testing needed not appropriate for outpatient work up, Unsafe d/c plan and IV treatments appropriate due to intensity of illness or inability to take PO   Dispo: The patient is  from: Home              Anticipated d/c is to: Home              Anticipated d/c date is: 2 days              Patient currently is not medically stable to d/c.       Consultants:   NONE.    Procedures: NONE.    Antimicrobials: rocephin for UTI.    Subjective: No nausea, vomiting or abdominal pain.   Objective: Vitals:   08/29/20 0600 08/29/20 0700 08/29/20 0745 08/29/20 0800  BP: (!) 96/58 (!) 95/56  105/69  Pulse: (!) 102 99  (!) 102  Resp: 15 16  12   Temp:   98.4 F (36.9 C)   TempSrc:   Oral   SpO2: 97% 98%  98%  Weight:      Height:        Intake/Output Summary (Last 24 hours) at 08/29/2020 1022 Last data filed at 08/29/2020 0400 Gross per 24 hour  Intake 1899.71 ml  Output --  Net 1899.71 ml   Filed Weights   08/28/20 1543  Weight: 59 kg    Examination:  General exam: Appears calm and comfortable  Respiratory system: Clear to auscultation. Respiratory effort normal. Cardiovascular system: S1 & S2 heard, RRR. No JVD,  No pedal edema. Gastrointestinal system: Abdomen is nondistended, soft and nontender.  Normal bowel sounds heard. Central nervous system:  Alert and oriented. No focal neurological deficits. Extremities: Symmetric 5 x 5 power. Skin: No rashes, lesions or ulcers Psychiatry:  Mood & affect appropriate.     Data Reviewed: I have personally reviewed following labs and imaging studies  CBC: Recent Labs  Lab 08/28/20 1400 08/29/20 0301  WBC 16.0* 11.6*  NEUTROABS 14.6*  --   HGB 13.0 10.5*  HCT 39.6 30.7*  MCV 93.8 89.5  PLT 295 245    Basic Metabolic Panel: Recent Labs  Lab 08/28/20 1750 08/28/20 1928 08/28/20 2242 08/29/20 0301 08/29/20 0831  NA 148* 143 142 141 142  K 4.8 3.6 3.5 3.4* 3.0*  CL 121* 114* 114* 113* 112*  CO2 <7* 14* 15* 17* 18*  GLUCOSE 226* 215* 226* 241* 142*  BUN 13 14 12 11 10   CREATININE 0.80 0.96 0.86 0.75 0.63  CALCIUM 6.3* 8.7* 8.6* 8.3* 8.4*  MG  --  1.7  --  1.5*  --   PHOS  --  1.0*   --  3.6  --     GFR: Estimated Creatinine Clearance: 92.5 mL/min (by C-G formula based on SCr of 0.63 mg/dL).  Liver Function Tests: Recent Labs  Lab 08/28/20 1400 08/28/20 1928  AST 12* 9*  ALT 13 12  ALKPHOS 74 63  BILITOT 1.9* 1.4*  PROT 8.1 7.3  ALBUMIN 3.9 3.5    CBG: Recent Labs  Lab 08/29/20 0524 08/29/20 0629 08/29/20 0751 08/29/20 0848 08/29/20 0951  GLUCAP 176* 172* 130* 125* 99     Recent Results (from the past 240 hour(s))  Resp Panel by RT-PCR (Flu A&B, Covid) Nasopharyngeal Swab     Status: None   Collection Time: 08/28/20  2:11 PM   Specimen: Nasopharyngeal Swab; Nasopharyngeal(NP) swabs in vial transport medium  Result Value Ref Range Status   SARS Coronavirus 2 by RT PCR NEGATIVE NEGATIVE Final    Comment: (NOTE) SARS-CoV-2 target nucleic acids are NOT DETECTED.  The SARS-CoV-2 RNA is generally detectable in upper respiratory specimens during the acute phase of infection. The lowest concentration of SARS-CoV-2 viral copies this assay can detect is 138 copies/mL. A negative result does not preclude SARS-Cov-2 infection and should not be used as the sole basis for treatment or other patient management decisions. A negative result may occur with  improper specimen collection/handling, submission of specimen other than nasopharyngeal swab, presence of viral mutation(s) within the areas targeted by this assay, and inadequate number of viral copies(<138 copies/mL). A negative result must be combined with clinical observations, patient history, and epidemiological information. The expected result is Negative.  Fact Sheet for Patients:  08/30/20  Fact Sheet for Healthcare Providers:  BloggerCourse.com  This test is no t yet approved or cleared by the [place] FDA and  has been authorized for detection and/or diagnosis of SARS-CoV-2 by FDA under an Emergency Use Authorization (EUA). This  EUA will remain  in effect (meaning this test can be used) for the duration of the COVID-19 declaration under Section 564(b)(1) of the Act, 21 U.S.C.section 360bbb-3(b)(1), unless the authorization is terminated  or revoked sooner.       Influenza A by PCR NEGATIVE NEGATIVE Final   Influenza B by PCR NEGATIVE NEGATIVE Final    Comment: (NOTE) The Xpert Xpress SARS-CoV-2/FLU/RSV plus assay is intended as an aid in the diagnosis of influenza from Nasopharyngeal swab specimens and should not be used as a sole basis for treatment. Nasal washings and aspirates are unacceptable for Xpert Xpress SARS-CoV-2/FLU/RSV testing.  Fact Sheet for Patients: BloggerCourse.com  Fact Sheet for Healthcare Providers: SeriousBroker.it  This test is not yet approved or cleared by the [place] FDA and has been authorized for detection and/or diagnosis of SARS-CoV-2 by FDA under an Emergency Use Authorization (EUA). This EUA will remain in effect (meaning this test can be used) for the duration of the COVID-19 declaration under Section 564(b)(1) of the Act, 21 U.S.C. section 360bbb-3(b)(1), unless the authorization is terminated or revoked.  Performed at [facility], [address] [place], [place] [address]   MRSA PCR Screening     Status: None   Collection Time: 08/28/20  6:59 PM   Specimen: Nasal Mucosa; Nasopharyngeal  Result Value Ref Range Status   MRSA by PCR NEGATIVE NEGATIVE Final    Comment:        The GeneXpert MRSA Assay (FDA approved for NASAL specimens only), is one component of a comprehensive MRSA colonization surveillance program. It is not intended to diagnose MRSA infection nor to guide or monitor treatment for MRSA infections. Performed at [facility], [address] [place], [place] [address]          Radiology Studies: [facility] Chest Port 1 View  Result Date:  08/28/2020 CLINICAL DATA:  Questionable sepsis. Evaluate for abnormality. Additional history provided: Nausea and diarrhea. EXAM: PORTABLE CHEST 1 VIEW COMPARISON:  Prior chest radiographs 08/31/2012. FINDINGS: Heart size within normal limits. No appreciable airspace consolidation.No evidence of pleural effusion or pneumothorax. No acute bony abnormality identified. IMPRESSION: No evidence of active cardiopulmonary disease. Electronically Signed   By: Jackey Loge DO   On: 08/28/2020 14:43   CT Renal Stone Study  Result Date: 08/28/2020 CLINICAL DATA:  Bilateral flank pain and nausea EXAM: CT ABDOMEN AND PELVIS WITHOUT CONTRAST TECHNIQUE: Multidetector CT imaging of the abdomen and pelvis was performed following the standard protocol without IV contrast. COMPARISON:  None. FINDINGS: Lower chest: The visualized heart size within normal limits. No pericardial fluid/thickening. No hiatal hernia. The visualized portions of the lungs are clear. Hepatobiliary: Although limited due to the lack of intravenous contrast, normal in appearance without gross focal abnormality. No evidence of calcified gallstones or biliary ductal dilatation. Pancreas:  Unremarkable.  No surrounding inflammatory changes. Spleen: Normal in size. Although limited due to the lack of intravenous contrast, normal in appearance. Adrenals/Urinary Tract: Both adrenal glands appear normal. Mild bilateral pelvicaliectasis, right greater than left is seen down to the level of the UVJ. No renal or collecting system calculi however are noted. There is a dilated fluid-filled bladder without bladder wall thickening. Stomach/Bowel: The stomach, small bowel, and colon are normal in appearance. No inflammatory changes or obstructive findings. appendix is normal. Vascular/Lymphatic: There are no enlarged abdominal or pelvic lymph nodes. Scattered mild aortic atherosclerosis is seen. Reproductive: The uterus and adnexa are unremarkable. Other: No evidence of  abdominal wall mass or hernia. Musculoskeletal: No acute or significant osseous findings. IMPRESSION: Mild bilateral hydronephrosis to the UVJ with a significantly dilated fluid-filled bladder which could be due to bladder outlet obstruction. No renal or collecting system calculi Aortic Atherosclerosis (ICD10-I70.0).  Advanced for age Electronically Signed   By: Jonna Clark M.D.   On: 08/28/2020 15:46        Scheduled Meds: . Chlorhexidine Gluconate Cloth  6 each Topical Daily  . enoxaparin (LOVENOX) injection  40 mg Subcutaneous Q24H  . mouth rinse  15 mL Mouth Rinse BID  . metoCLOPramide (REGLAN) injection  5 mg Intravenous Q8H  .  morphine injection  4 mg Intravenous Once  . phosphorus  500 mg Oral QID  . potassium chloride  40 mEq Oral Once   Continuous Infusions: . cefTRIAXone (ROCEPHIN)  IV    . dextrose 5% lactated ringers 125 mL/hr at 08/29/20 0400  . insulin 4.4 mL/hr at 08/29/20 0400  . lactated ringers Stopped (08/28/20 1831)  . magnesium sulfate bolus IVPB 3 g (08/29/20 0942)  . sodium chloride       LOS: 1 day       Kathlen ModyVijaya Chele Cornell, MD [facility]   To contact the attending provider between 7A-7P or the covering provider during after hours 7P-7A, please log into the web site [email or web address] and access using universal [place] password for that web site. If you do not have the password, please call the hospital operator.  08/29/2020, 10:22 AM

## 2020-08-29 NOTE — Progress Notes (Addendum)
Inpatient Diabetes Program Recommendations  AACE/ADA: New Consensus Statement on Inpatient Glycemic Control (2015)  Target Ranges:  Prepandial:   less than 140 mg/dL      Peak postprandial:   less than 180 mg/dL (1-2 hours)      Critically ill patients:  140 - 180 mg/dL   Lab Results  Component Value Date   GLUCAP 99 08/29/2020   HGBA1C 9.1 (H) 11/02/2014    Review of Glycemic Control Results for Chelsea Sparks, Chelsea Sparks (MRN [id number]) as of 08/29/2020 10:19  Ref. Range 08/29/2020 04:08 08/29/2020 05:24 08/29/2020 06:29 08/29/2020 07:51 08/29/2020 08:48 08/29/2020 09:51  Glucose-Capillary Latest Ref Range: 70 - 99 mg/dL 607 (H) 371 (H) 062 (H) 130 (H) 125 (H) 99   Diabetes history: Type 1 DM since age 29 Outpatient Diabetes medications:  Had been taking Novolin NPH 15 units q HS- Was not taking any rapid acting insulin Current orders for Inpatient glycemic control:  IV insulin-DKA order set  Inpatient Diabetes Program Recommendations:    Called and spoke to patient by phone.  She has not seen provider in a long time and has been buying Novolin N from [place]. She has been on basal/bolus regimen in the past including Levemir/Lantus plus Novolog/Humalog.  She covered 1 unit for every 15 grams of CHO and 1 unit drops blood sugar approximately 50 mg/dL.  She states that she does have insurance- [employer].  Requested benefits check for insulins.  She will also need PCP/ provider to prescribe insulins after hospitalization.  If insulins not covered by insurance, patient will likely need 70/30 regimen.  Discussed with patient and reminder her that this would include basal/bolus and needs to be given with food twice a day.  Patient prefers basal/bolus regimen if affordable.   -Once acidosis cleared consider, Lantus 15 units daily, Novolog 3 units tid with meals (hold if patient eats less than 50% or NPO) and Novolog very sensitive (0-6 units) tid with meals and HS.   Thanks,  Beryl Meager, RN,  BC-ADM Inpatient Diabetes Coordinator Pager [phone] (8a-5p)

## 2020-08-29 NOTE — TOC Progression Note (Signed)
Transition of Care [facility]) - Progression Note    Patient Details  Name: Chelsea Sparks MRN: [id number] Date of Birth: 09/18/1990  [facility] CM/SW Contact  Geni Bers, RN Phone Number: 08/29/2020, 4:02 PM  Clinical Narrative:     Pt from home. TOC will follow for discharge needs.   Expected Discharge Plan: Home/Self Care Barriers to Discharge: No Barriers Identified  Expected Discharge Plan and Services Expected Discharge Plan: Home/Self Care       Living arrangements for the past 2 months: Single Family Home                                       Social Determinants of Health (SDOH) Interventions    Readmission Risk Interventions No flowsheet data found.

## 2020-08-29 NOTE — Discharge Instructions (Signed)

## 2020-08-30 LAB — CBC
HCT: 32.1 % — ABNORMAL LOW (ref 36.0–46.0)
Hemoglobin: 10.9 g/dL — ABNORMAL LOW (ref 12.0–15.0)
MCH: 30.9 pg (ref 26.0–34.0)
MCHC: 34 g/dL (ref 30.0–36.0)
MCV: 90.9 fL (ref 80.0–100.0)
Platelets: 235 10*3/uL (ref 150–400)
RBC: 3.53 MIL/uL — ABNORMAL LOW (ref 3.87–5.11)
RDW: 13.2 % (ref 11.5–15.5)
WBC: 8 10*3/uL (ref 4.0–10.5)
nRBC: 0 % (ref 0.0–0.2)

## 2020-08-30 LAB — BASIC METABOLIC PANEL
Anion gap: 9 (ref 5–15)
BUN: 6 mg/dL (ref 6–20)
CO2: 19 mmol/L — ABNORMAL LOW (ref 22–32)
Calcium: 8.1 mg/dL — ABNORMAL LOW (ref 8.9–10.3)
Chloride: 111 mmol/L (ref 98–111)
Creatinine, Ser: 0.64 mg/dL (ref 0.44–1.00)
GFR, Estimated: >60 mL/min (ref >60)
Glucose, Bld: 131 mg/dL — ABNORMAL HIGH (ref 70–99)
Potassium: 3.2 mmol/L — ABNORMAL LOW (ref 3.5–5.1)
Sodium: 139 mmol/L (ref 135–145)

## 2020-08-30 LAB — URINE CULTURE: Culture: >=100000 — AB

## 2020-08-30 LAB — GLUCOSE, CAPILLARY
Glucose-Capillary: 119 mg/dL — ABNORMAL HIGH (ref 70–99)
Glucose-Capillary: 106 mg/dL — ABNORMAL HIGH (ref 70–99)
Glucose-Capillary: 85 mg/dL (ref 70–99)

## 2020-08-30 LAB — MAGNESIUM: Magnesium: 1.8 mg/dL (ref 1.7–2.4)

## 2020-08-30 LAB — HEMOGLOBIN A1C
Hgb A1c MFr Bld: 13.1 % — ABNORMAL HIGH (ref 4.8–5.6)
Mean Plasma Glucose: 329.27 mg/dL

## 2020-08-30 LAB — PHOSPHORUS: Phosphorus: 3.6 mg/dL (ref 2.5–4.6)

## 2020-08-30 MED ORDER — INSULIN ASPART 100 UNIT/ML FLEXPEN: PEN_INJECTOR | SUBCUTANEOUS | 11 refills | Status: DC

## 2020-08-30 MED ORDER — INSULIN DETEMIR 100 UNIT/ML FLEXPEN: 15.0000 [IU] | PEN_INJECTOR | Freq: Every day | SUBCUTANEOUS | 11 refills | Status: AC

## 2020-08-30 MED ORDER — POTASSIUM CHLORIDE CRYS ER 20 MEQ PO TBCR
40.0000 meq | EXTENDED_RELEASE_TABLET | Freq: Two times a day (BID) | ORAL | Status: DC
  Administered 2020-08-30: 10:00:00 40 meq via ORAL
  Filled 2020-08-30: qty 2

## 2020-08-30 MED ORDER — NOVOFINE PLUS PEN NEEDLE 32G X 4 MM MISC: 11 refills | Status: AC

## 2020-08-30 MED ORDER — NOVOFINE PLUS PEN NEEDLE 32G X 4 MM MISC: 11 refills | Status: DC

## 2020-08-30 MED ORDER — INSULIN ASPART 100 UNIT/ML FLEXPEN: PEN_INJECTOR | SUBCUTANEOUS | 11 refills | Status: AC

## 2020-08-30 MED ORDER — CEPHALEXIN 500 MG PO CAPS: 500.0000 mg | ORAL_CAPSULE | Freq: Two times a day (BID) | ORAL | 0 refills | Status: AC

## 2020-08-30 MED ORDER — INSULIN ASPART 100 UNIT/ML ~~LOC~~ SOLN: 0.0000 [IU] | Freq: Three times a day (TID) | SUBCUTANEOUS | Status: DC

## 2020-08-30 MED ORDER — ONDANSETRON HCL 4 MG PO TABS
4.0000 mg | ORAL_TABLET | Freq: Three times a day (TID) | ORAL | 0 refills | Status: AC | PRN
Start: 2020-08-30 — End: ?

## 2020-08-30 MED ORDER — INSULIN DETEMIR 100 UNIT/ML FLEXPEN: 15.0000 [IU] | PEN_INJECTOR | Freq: Every day | SUBCUTANEOUS | 11 refills | Status: DC

## 2020-08-30 NOTE — Progress Notes (Signed)
Patient discharge education complete. New prescriptions reviewed. IV's removed. Staff escorted out to car. Belongings with patient and all questions answered.

## 2020-08-30 NOTE — Plan of Care (Signed)
Problem: Education: Goal: Knowledge of General Education information will improve Description: Including pain rating scale, medication(s)/side effects and non-pharmacologic comfort measures Outcome: Adequate for Discharge   Problem: Health Behavior/Discharge Planning: Goal: Ability to manage health-related needs will improve Outcome: Adequate for Discharge   Problem: Clinical Measurements: Goal: Ability to maintain clinical measurements within normal limits will improve Outcome: Adequate for Discharge Goal: Will remain free from infection Outcome: Adequate for Discharge Goal: Diagnostic test results will improve Outcome: Adequate for Discharge Goal: Respiratory complications will improve Outcome: Adequate for Discharge Goal: Cardiovascular complication will be avoided Outcome: Adequate for Discharge   Problem: Activity: Goal: Risk for activity intolerance will decrease Outcome: Adequate for Discharge   Problem: Nutrition: Goal: Adequate nutrition will be maintained Outcome: Adequate for Discharge   Problem: Coping: Goal: Level of anxiety will decrease Outcome: Adequate for Discharge   Problem: Elimination: Goal: Will not experience complications related to bowel motility Outcome: Adequate for Discharge Goal: Will not experience complications related to urinary retention Outcome: Adequate for Discharge   Problem: Pain Managment: Goal: General experience of comfort will improve Outcome: Adequate for Discharge   Problem: Safety: Goal: Ability to remain free from injury will improve Outcome: Adequate for Discharge   Problem: Skin Integrity: Goal: Risk for impaired skin integrity will decrease Outcome: Adequate for Discharge   Problem: Education: Goal: Ability to describe self-care measures that may prevent or decrease complications (Diabetes Survival Skills Education) will improve Outcome: Adequate for Discharge Goal: Individualized Educational Video(s) Outcome:  Adequate for Discharge   Problem: Coping: Goal: Ability to adjust to condition or change in health will improve Outcome: Adequate for Discharge   Problem: Fluid Volume: Goal: Ability to maintain a balanced intake and output will improve Outcome: Adequate for Discharge   Problem: Health Behavior/Discharge Planning: Goal: Ability to identify and utilize available resources and services will improve Outcome: Adequate for Discharge Goal: Ability to manage health-related needs will improve Outcome: Adequate for Discharge   Problem: Metabolic: Goal: Ability to maintain appropriate glucose levels will improve Outcome: Adequate for Discharge   Problem: Nutritional: Goal: Maintenance of adequate nutrition will improve Outcome: Adequate for Discharge Goal: Progress toward achieving an optimal weight will improve Outcome: Adequate for Discharge   Problem: Skin Integrity: Goal: Risk for impaired skin integrity will decrease Outcome: Adequate for Discharge   Problem: Tissue Perfusion: Goal: Adequacy of tissue perfusion will improve Outcome: Adequate for Discharge   Problem: Education: Goal: Ability to describe self-care measures that may prevent or decrease complications (Diabetes Survival Skills Education) will improve Outcome: Adequate for Discharge Goal: Individualized Educational Video(s) Outcome: Adequate for Discharge   Problem: Cardiac: Goal: Ability to maintain an adequate cardiac output will improve Outcome: Adequate for Discharge   Problem: Health Behavior/Discharge Planning: Goal: Ability to identify and utilize available resources and services will improve Outcome: Adequate for Discharge Goal: Ability to manage health-related needs will improve Outcome: Adequate for Discharge   Problem: Fluid Volume: Goal: Ability to achieve a balanced intake and output will improve Outcome: Adequate for Discharge   Problem: Metabolic: Goal: Ability to maintain appropriate  glucose levels will improve Outcome: Adequate for Discharge   Problem: Nutritional: Goal: Maintenance of adequate nutrition will improve Outcome: Adequate for Discharge Goal: Maintenance of adequate weight for body size and type will improve Outcome: Adequate for Discharge   Problem: Respiratory: Goal: Will regain and/or maintain adequate ventilation Outcome: Adequate for Discharge   Problem: Urinary Elimination: Goal: Ability to achieve and maintain adequate renal perfusion and functioning will improve   Outcome: Adequate for Discharge   

## 2020-08-31 NOTE — Discharge Summary (Signed)
Physician Discharge Summary  Chelsea Sparks WUJ:[id number] DOB: 03/05/1991 DOA: 08/28/2020  PCP: Patient, No Pcp Per  Admit date: 08/28/2020 Discharge date: 08/30/2020  Admitted From: Home Disposition:  Home.   Recommendations for Outpatient Follow-up:  1. Follow up with PCP in 1-2 weeks 2. Please obtain BMP/CBC in one week Please follow up with endocrinology in 1 to 2 weeks  Please follow up with urology in 1 week for the bilateral hydronephrosis.  Discharge Condition:Stable.  CODE STATUS:FULL CODE.  Diet recommendation: Heart Healthy / Carb Modified   Brief/Interim Summary:  Chelsea Rankinmanda C Bartleyis a 29 y.o.femalewith medical history significant ofType 1 DM, anxiety, hypertension, panic attacks, prsented to ED with complaints of nausea, vomiting, loose bowel movements since two days associated with foul smelling urine, dysuria and mild back pain. She reports missing two days of insulin . She was found to be in DKA, UTI and mild bilateral hydronephrosis.    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  cbg's are are less than 200 this am.  Transition to Levemir 15 units daily with SSI.  TOC consult for meds and PCP.    Mild AKI Probably from DKA, resolved with IV fluids.    UTI with bilateral hydronephrosis:  Started on IV rocephin,  Urine cultures grew strep agalactiae. Discharged on oral keflex to complete the course.  Pt urinating adequately. Recommend outpatient follow up with Urology.    Hypokalemia and hypomagnesemia:  Replaced.     Anemia , normocytic. Marland Kitchen.  Suspect the hemoglobin of 13 on admission is from a concentrated sample from DKA.  Dropped to 10 after IV fluids, ? Hemodilution or baseline hemoglobin.  Continue to  monitor.      Discharge Instructions  Discharge Instructions    Ambulatory referral to Urology   Complete by: As directed    Discharge instructions   Complete by: As directed    Please follow up with Urology in one week.     Allergies as of 08/30/2020      Reactions   Corn-containing Products Nausea And Vomiting      Medication List    STOP taking these medications   insulin aspart 100 UNIT/ML injection Commonly known as: novoLOG Replaced by: insulin aspart 100 UNIT/ML FlexPen   insulin lispro 100 UNIT/ML injection Commonly known as: HumaLOG   insulin NPH Human 100 UNIT/ML injection Commonly known as: NOVOLIN N   metoCLOPramide 10 MG tablet Commonly known as: REGLAN     TAKE these medications   cephALEXin 500 MG capsule Commonly known as: Keflex Take 1 capsule (500 mg total) by mouth 2 (two) times daily for 5 days.   insulin aspart 100 UNIT/ML FlexPen Commonly known as: NOVOLOG CBG 70 - 120: 0 units CBG 121 - 150: 0 units CBG 151 - 200: 1 unit CBG 201-250: 2 units CBG 251-300: 3 units CBG 301-350: 4 units CBG 351-400: 5 units Replaces: insulin aspart 100 UNIT/ML injection   insulin detemir 100 UNIT/ML FlexPen Commonly known as: LEVEMIR Inject 15 Units into the skin daily. What changed:   how much to take  when to take this   MULTIVITAMIN WOMEN PO Take 2 tablets by mouth daily.   NovoFine Plus Pen Needle 32G X 4 MM Misc Generic  drug: Insulin Pen Needle Use for Novolog flex pens and levemir pen  For type 1 DM.   ondansetron 4 MG tablet Commonly known as: ZOFRAN Take 1 tablet (4 mg total) by mouth every 8 (eight) hours as needed for nausea or vomiting.       Allergies  Allergen Reactions  . Corn-Containing Products Nausea And Vomiting    Consultations:  None.    Procedures/Studies: DG Chest Port 1 View  Result Date: 08/28/2020 CLINICAL DATA:  Questionable sepsis. Evaluate for abnormality. Additional history provided: Nausea and  diarrhea. EXAM: PORTABLE CHEST 1 VIEW COMPARISON:  Prior chest radiographs 08/31/2012. FINDINGS: Heart size within normal limits. No appreciable airspace consolidation.No evidence of pleural effusion or pneumothorax. No acute bony abnormality identified. IMPRESSION: No evidence of active cardiopulmonary disease. Electronically Signed   By: Jackey Loge DO   On: 08/28/2020 14:43   CT Renal Stone Study  Result Date: 08/28/2020 CLINICAL DATA:  Bilateral flank pain and nausea EXAM: CT ABDOMEN AND PELVIS WITHOUT CONTRAST TECHNIQUE: Multidetector CT imaging of the abdomen and pelvis was performed following the standard protocol without IV contrast. COMPARISON:  None. FINDINGS: Lower chest: The visualized heart size within normal limits. No pericardial fluid/thickening. No hiatal hernia. The visualized portions of the lungs are clear. Hepatobiliary: Although limited due to the lack of intravenous contrast, normal in appearance without gross focal abnormality. No evidence of calcified gallstones or biliary ductal dilatation. Pancreas:  Unremarkable.  No surrounding inflammatory changes. Spleen: Normal in size. Although limited due to the lack of intravenous contrast, normal in appearance. Adrenals/Urinary Tract: Both adrenal glands appear normal. Mild bilateral pelvicaliectasis, right greater than left is seen down to the level of the UVJ. No renal or collecting system calculi however are noted. There is a dilated fluid-filled bladder without bladder wall thickening. Stomach/Bowel: The stomach, small bowel, and colon are normal in appearance. No inflammatory changes or obstructive findings. appendix is normal. Vascular/Lymphatic: There are no enlarged abdominal or pelvic lymph nodes. Scattered mild aortic atherosclerosis is seen. Reproductive: The uterus and adnexa are unremarkable. Other: No evidence of abdominal wall mass or hernia. Musculoskeletal: No acute or significant osseous findings. IMPRESSION: Mild bilateral  hydronephrosis to the UVJ with a significantly dilated fluid-filled bladder which could be due to bladder outlet obstruction. No renal or collecting system calculi Aortic Atherosclerosis (ICD10-I70.0).  Advanced for age Electronically Signed   By: Jonna Clark M.D.   On: 08/28/2020 15:46       Subjective: No new complaints.   Discharge Exam: Vitals:   08/30/20 0800 08/30/20 0900  BP: 96/62 105/70  Pulse: 92 (!) 110  Resp: 15 13  Temp: 98 F (36.7 C)   SpO2: 97% 100%   Vitals:   08/30/20 0545 08/30/20 0600 08/30/20 0800 08/30/20 0900  BP: (!) 96/51 114/71 96/62 105/70  Pulse: 94 100 92 (!) 110  Resp: 14 11 15 13   Temp:   98 F (36.7 C)   TempSrc:   Oral   SpO2: 97% 99% 97% 100%  Weight:      Height:        General: Pt is alert, awake, not in acute distress Cardiovascular: RRR, S1/S2 +, no rubs, no gallops Respiratory: CTA bilaterally, no wheezing, no rhonchi Abdominal: Soft, NT, ND, bowel sounds + Extremities: no edema, no cyanosis    The results of significant diagnostics from this hospitalization (including imaging, microbiology, ancillary and laboratory) are listed below for reference.     Microbiology: Recent Results (from the  past 240 hour(s))  Blood culture (routine x 2)     Status: None (Preliminary result)   Collection Time: 08/28/20  2:00 PM   Specimen: BLOOD  Result Value Ref Range Status   Specimen Description   Final    BLOOD LEFT ANTECUBITAL Performed at [facility], [address], [place], [place] [address]    Special Requests   Final    BOTTLES DRAWN AEROBIC AND ANAEROBIC Blood Culture adequate volume Performed at [facility], [address], [place], [place] [address]    Culture   Final    NO GROWTH 3 DAYS Performed at [facility], [address] [place], [place] [address]    Report Status PENDING  Incomplete  Blood culture (routine x 2)     Status: None (Preliminary result)   Collection  Time: 08/28/20  2:00 PM   Specimen: BLOOD  Result Value Ref Range Status   Specimen Description   Final    BLOOD RIGHT ANTECUBITAL Performed at [facility], [address], [place], [place] [address]    Special Requests   Final    BOTTLES DRAWN AEROBIC ONLY Blood Culture results may not be optimal due to an inadequate volume of blood received in culture bottles Performed at [facility], [address], [place], [place] [address]    Culture   Final    NO GROWTH 3 DAYS Performed at [facility], [address], [place], [place] [address]    Report Status PENDING  Incomplete  Resp Panel by RT-PCR (Flu A&B, Covid) Nasopharyngeal Swab     Status: None   Collection Time: 08/28/20  2:11 PM   Specimen: Nasopharyngeal Swab; Nasopharyngeal(NP) swabs in vial transport medium  Result Value Ref Range Status   SARS Coronavirus 2 by RT PCR NEGATIVE NEGATIVE Final    Comment: (NOTE) SARS-CoV-2 target nucleic acids are NOT DETECTED.  The SARS-CoV-2 RNA is generally detectable in upper respiratory specimens during the acute phase of infection. The lowest concentration of SARS-CoV-2 viral copies this assay can detect is 138 copies/mL. A negative result does not preclude SARS-Cov-2 infection and should not be used as the sole basis for treatment or other patient management decisions. A negative result may occur with  improper specimen collection/handling, submission of specimen other than nasopharyngeal swab, presence of viral mutation(s) within the areas targeted by this assay, and inadequate number of viral copies(<138 copies/mL). A negative result must be combined with clinical observations, patient history, and epidemiological information. The expected result is Negative.  Fact Sheet for Patients:  BloggerCourse.com  Fact Sheet for Healthcare Providers:  SeriousBroker.it  This test is no t yet  approved or cleared by the [place] FDA and  has been authorized for detection and/or diagnosis of SARS-CoV-2 by FDA under an Emergency Use Authorization (EUA). This EUA will remain  in effect (meaning this test can be used) for the duration of the COVID-19 declaration under Section 564(b)(1) of the Act, 21 U.S.C.section 360bbb-3(b)(1), unless the authorization is terminated  or revoked sooner.       Influenza A by PCR NEGATIVE NEGATIVE Final   Influenza B by PCR NEGATIVE NEGATIVE Final    Comment: (NOTE) The Xpert Xpress SARS-CoV-2/FLU/RSV plus assay is intended as an aid in the diagnosis of influenza from Nasopharyngeal swab specimens and should not be used as a sole basis for treatment. Nasal washings and aspirates are unacceptable for Xpert Xpress SARS-CoV-2/FLU/RSV testing.  Fact Sheet for Patients: BloggerCourse.com  Fact Sheet for Healthcare Providers: SeriousBroker.it  This test is not yet approved or cleared by the [place] FDA and has been authorized for detection and/or diagnosis of SARS-CoV-2 by FDA under an Emergency Use Authorization (EUA). This EUA will remain in effect (meaning this test can be used) for the duration of the COVID-19 declaration under Section 564(b)(1) of the Act, 21 U.S.C. section 360bbb-3(b)(1), unless the authorization is terminated or revoked.  Performed at [facility], [address] [place], [place] [address]   Urine culture     Status: Abnormal   Collection Time: 08/28/20  4:11 PM   Specimen: In/Out Cath Urine  Result Value Ref Range Status   Specimen Description   Final    IN/OUT CATH URINE Performed at [facility], [address] [place], [place] [address]    Special Requests   Final    NONE Performed at [facility], [address] [place], [place] [address]    Culture (A)  Final    >=100,000 COLONIES/mL  GROUP B STREP(S.AGALACTIAE)ISOLATED TESTING AGAINST S. AGALACTIAE NOT ROUTINELY PERFORMED DUE TO PREDICTABILITY OF AMP/PEN/VAN SUSCEPTIBILITY. Performed at [facility], [address] [place], [place] [address]    Report Status 08/30/2020 FINAL  Final  MRSA PCR Screening     Status: None   Collection Time: 08/28/20  6:59 PM   Specimen: Nasal Mucosa; Nasopharyngeal  Result Value Ref Range Status   MRSA by PCR NEGATIVE NEGATIVE Final    Comment:        The GeneXpert MRSA Assay (FDA approved for NASAL specimens only), is one component of a comprehensive MRSA colonization surveillance program. It is not intended to diagnose MRSA infection nor to guide or monitor treatment for MRSA infections. Performed at [facility], [address] [place], [place] [address]      Labs: BNP (last 3 results) No results for input(s): BNP in the last 8760 hours. Basic Metabolic Panel: Recent Labs  Lab 08/28/20 1928 08/28/20 2242 08/29/20 0301 08/29/20 0831 08/29/20 1657 08/30/20 0300  NA 143 142 141 142 138 139  K 3.6 3.5 3.4* 3.0* 3.3* 3.2*  CL 114* 114* 113* 112* 109 111  CO2 14* 15* 17* 18* 20* 19*  GLUCOSE 215* 226* 241* 142* 212* 131*  BUN 14 12 11 10 7 6   CREATININE 0.96 0.86 0.75 0.63 0.66 0.64  CALCIUM 8.7* 8.6* 8.3* 8.4* 8.2* 8.1*  MG 1.7  --  1.5*  --   --  1.8  PHOS 1.0*  --  3.6  --   --  3.6   Liver Function Tests: Recent Labs  Lab 08/28/20 1400 08/28/20 1928  AST 12* 9*  ALT 13 12  ALKPHOS 74 63  BILITOT 1.9* 1.4*  PROT 8.1 7.3  ALBUMIN 3.9 3.5   No results for input(s): LIPASE, AMYLASE in the last 168 hours. No results for input(s): AMMONIA in the last 168 hours. CBC: Recent Labs  Lab 08/28/20 1400 08/29/20 0301 08/30/20 0300  WBC 16.0* 11.6* 8.0  NEUTROABS 14.6*  --   --   HGB 13.0 10.5* 10.9*  HCT 39.6 30.7* 32.1*  MCV 93.8 89.5 90.9  PLT 295 245 235   Cardiac Enzymes: No results for input(s): CKTOTAL, CKMB, CKMBINDEX,  TROPONINI in the last 168 hours. BNP: Invalid input(s): POCBNP CBG: Recent Labs  Lab 08/29/20 2157 08/29/20 2343 08/30/20 0322 08/30/20 0554 08/30/20 0809  GLUCAP  99 156* 119* 106* 85   D-Dimer No results for input(s): DDIMER in the last 72 hours. Hgb A1c Recent Labs    08/30/20 0300  HGBA1C 13.1*   Lipid Profile No results for input(s): CHOL, HDL, LDLCALC, TRIG, CHOLHDL, LDLDIRECT in the last 72 hours. Thyroid function studies No results for input(s): TSH, T4TOTAL, T3FREE, THYROIDAB in the last 72 hours.  Invalid input(s): FREET3 Anemia work up No results for input(s): VITAMINB12, FOLATE, FERRITIN, TIBC, IRON, RETICCTPCT in the last 72 hours. Urinalysis    Component Value Date/Time   COLORURINE YELLOW 08/28/2020 1611   APPEARANCEUR CLOUDY (A) 08/28/2020 1611   LABSPEC 1.016 08/28/2020 1611   PHURINE 5.0 08/28/2020 1611   GLUCOSEU >=500 (A) 08/28/2020 1611   GLUCOSEU NEGATIVE 03/03/2012 1020   HGBUR MODERATE (A) 08/28/2020 1611   BILIRUBINUR NEGATIVE 08/28/2020 1611   BILIRUBINUR neg 11/20/2011 1311   KETONESUR 80 (A) 08/28/2020 1611   PROTEINUR 30 (A) 08/28/2020 1611   UROBILINOGEN 0.2 03/21/2014 1546   NITRITE NEGATIVE 08/28/2020 1611   LEUKOCYTESUR LARGE (A) 08/28/2020 1611   Sepsis Labs Invalid input(s): PROCALCITONIN,  WBC,  LACTICIDVEN Microbiology Recent Results (from the past 240 hour(s))  Blood culture (routine x 2)     Status: None (Preliminary result)   Collection Time: 08/28/20  2:00 PM   Specimen: BLOOD  Result Value Ref Range Status   Specimen Description   Final    BLOOD LEFT ANTECUBITAL Performed at [facility], [address] [place], [place] [address]    Special Requests   Final    BOTTLES DRAWN AEROBIC AND ANAEROBIC Blood Culture adequate volume Performed at [facility], [address], [place], [place] [address]    Culture   Final    NO GROWTH 3 DAYS Performed at [facility], [address], [place], [place] [address]    Report Status PENDING  Incomplete  Blood culture (routine x 2)     Status: None (Preliminary result)   Collection Time: 08/28/20  2:00 PM   Specimen: BLOOD  Result Value Ref Range Status   Specimen Description   Final    BLOOD RIGHT ANTECUBITAL Performed at [facility], [address], [place], [place] [address]    Special Requests   Final    BOTTLES DRAWN AEROBIC ONLY Blood Culture results may not be optimal due to an inadequate volume of blood received in culture bottles Performed at [facility], [address], [place], [place] [address]    Culture   Final    NO GROWTH 3 DAYS Performed at [facility], [address], [place], [place] [address]    Report Status PENDING  Incomplete  Resp Panel by RT-PCR (Flu A&B, Covid) Nasopharyngeal Swab     Status: None   Collection Time: 08/28/20  2:11 PM   Specimen: Nasopharyngeal Swab; Nasopharyngeal(NP) swabs in vial transport medium  Result Value Ref Range Status   SARS Coronavirus 2 by RT PCR NEGATIVE NEGATIVE Final    Comment: (NOTE) SARS-CoV-2 target nucleic acids are NOT DETECTED.  The SARS-CoV-2 RNA is generally detectable in upper respiratory specimens during the acute phase of infection. The lowest concentration of SARS-CoV-2 viral copies this assay can detect is 138 copies/mL. A negative result does not preclude SARS-Cov-2 infection and should not be used as the sole basis for treatment or other patient management decisions. A negative result may occur with  improper specimen collection/handling, submission of  specimen other than nasopharyngeal swab, presence of viral mutation(s) within the areas targeted by this assay, and inadequate number of viral copies(<138 copies/mL). A negative result must be combined with clinical observations, patient history, and epidemiological information. The expected result is Negative.  Fact Sheet for Patients:   BloggerCourse.com  Fact Sheet for Healthcare Providers:  SeriousBroker.it  This test is no t yet approved or cleared by the [place] FDA and  has been authorized for detection and/or diagnosis of SARS-CoV-2 by FDA under an Emergency Use Authorization (EUA). This EUA will remain  in effect (meaning this test can be used) for the duration of the COVID-19 declaration under Section 564(b)(1) of the Act, 21 U.S.C.section 360bbb-3(b)(1), unless the authorization is terminated  or revoked sooner.       Influenza A by PCR NEGATIVE NEGATIVE Final   Influenza B by PCR NEGATIVE NEGATIVE Final    Comment: (NOTE) The Xpert Xpress SARS-CoV-2/FLU/RSV plus assay is intended as an aid in the diagnosis of influenza from Nasopharyngeal swab specimens and should not be used as a sole basis for treatment. Nasal washings and aspirates are unacceptable for Xpert Xpress SARS-CoV-2/FLU/RSV testing.  Fact Sheet for Patients: BloggerCourse.com  Fact Sheet for Healthcare Providers: SeriousBroker.it  This test is not yet approved or cleared by the [place] FDA and has been authorized for detection and/or diagnosis of SARS-CoV-2 by FDA under an Emergency Use Authorization (EUA). This EUA will remain in effect (meaning this test can be used) for the duration of the COVID-19 declaration under Section 564(b)(1) of the Act, 21 U.S.C. section 360bbb-3(b)(1), unless the authorization is terminated or revoked.  Performed at [facility] [address], [place], [place] [address]   Urine culture     Status: Abnormal   Collection Time: 08/28/20  4:11 PM   Specimen: In/Out Cath Urine  Result Value Ref Range Status   Specimen Description   Final    IN/OUT CATH URINE Performed at [facility], [address] [place], [place] [address]    Special Requests   Final     NONE Performed at [facility], [address], [place], [place] [address]    Culture (A)  Final    >=100,000 COLONIES/mL GROUP B STREP(S.AGALACTIAE)ISOLATED TESTING AGAINST S. AGALACTIAE NOT ROUTINELY PERFORMED DUE TO PREDICTABILITY OF AMP/PEN/VAN SUSCEPTIBILITY. Performed at [facility], [address], [place], [place] [address]    Report Status 08/30/2020 FINAL  Final  MRSA PCR Screening     Status: None   Collection Time: 08/28/20  6:59 PM   Specimen: Nasal Mucosa; Nasopharyngeal  Result Value Ref Range Status   MRSA by PCR NEGATIVE NEGATIVE Final    Comment:        The GeneXpert MRSA Assay (FDA approved for NASAL specimens only), is one component of a comprehensive MRSA colonization surveillance program. It is not intended to diagnose MRSA infection nor to guide or monitor treatment for MRSA infections. Performed at [facility], [address], [place], [place] [address]      Time coordinating discharge: 32 minutes.   SIGNED:   Kathlen Mody, MD  [facility]

## 2020-09-02 LAB — CULTURE, BLOOD (ROUTINE X 2)
Culture: NO GROWTH
Culture: NO GROWTH
Special Requests: ADEQUATE

## 2020-09-10 ENCOUNTER — Ambulatory Visit: Payer: Self-pay | Admitting: Urology
